# Patient Record
Sex: Male | Born: 1965 | Race: White | Hispanic: No | Marital: Married | Smoking: Never smoker
Health system: Southern US, Community
[De-identification: ages and names within clinical notes are randomized; demographics above are authoritative.]

## PROBLEM LIST (undated history)

## (undated) DIAGNOSIS — R7303 Prediabetes: Secondary | ICD-10-CM

## (undated) DIAGNOSIS — I10 Essential (primary) hypertension: Secondary | ICD-10-CM

## (undated) DIAGNOSIS — E785 Hyperlipidemia, unspecified: Secondary | ICD-10-CM

## (undated) HISTORY — DX: Hyperlipidemia, unspecified: E78.5

---

## 2017-06-16 ENCOUNTER — Emergency Department (HOSPITAL_COMMUNITY)
Admission: EM | Admit: 2017-06-16 | Discharge: 2017-06-16 | Disposition: A | Discharge status: Home / Self Care | Payer: Self-pay | Attending: Emergency Medicine | Admitting: Emergency Medicine

## 2017-06-16 ENCOUNTER — Encounter (HOSPITAL_COMMUNITY): Payer: Self-pay | Admitting: Emergency Medicine

## 2017-06-16 DIAGNOSIS — N3091 Cystitis, unspecified with hematuria: Secondary | ICD-10-CM | POA: Insufficient documentation

## 2017-06-16 DIAGNOSIS — Z7982 Long term (current) use of aspirin: Secondary | ICD-10-CM | POA: Insufficient documentation

## 2017-06-16 LAB — CBC WITH DIFFERENTIAL/PLATELET
Basophils Absolute: 0 10*3/uL (ref 0.0–0.1)
Basophils Relative: 0 %
EOS PCT: 0 %
Eosinophils Absolute: 0 10*3/uL (ref 0.0–0.7)
HEMATOCRIT: 45.3 % (ref 39.0–52.0)
Hemoglobin: 15.4 g/dL (ref 13.0–17.0)
LYMPHS ABS: 1.5 10*3/uL (ref 0.7–4.0)
LYMPHS PCT: 6 %
MCH: 31.4 pg (ref 26.0–34.0)
MCHC: 34 g/dL (ref 30.0–36.0)
MCV: 92.4 fL (ref 78.0–100.0)
MONO ABS: 3.1 10*3/uL — AB (ref 0.1–1.0)
Monocytes Relative: 13 %
NEUTROS ABS: 19.4 10*3/uL — AB (ref 1.7–7.7)
Neutrophils Relative %: 81 %
PLATELETS: 211 10*3/uL (ref 150–400)
RBC: 4.9 MIL/uL (ref 4.22–5.81)
RDW: 13.7 % (ref 11.5–15.5)
WBC: 24 10*3/uL — ABNORMAL HIGH (ref 4.0–10.5)

## 2017-06-16 LAB — COMPREHENSIVE METABOLIC PANEL
ALT: 25 U/L (ref 17–63)
AST: 32 U/L (ref 15–41)
Albumin: 4 g/dL (ref 3.5–5.0)
Alkaline Phosphatase: 69 U/L (ref 38–126)
Anion gap: 11 (ref 5–15)
BILIRUBIN TOTAL: 1.7 mg/dL — AB (ref 0.3–1.2)
BUN: 9 mg/dL (ref 6–20)
CHLORIDE: 100 mmol/L — AB (ref 101–111)
CO2: 26 mmol/L (ref 22–32)
CREATININE: 0.74 mg/dL (ref 0.61–1.24)
Calcium: 9.5 mg/dL (ref 8.9–10.3)
Glucose, Bld: 138 mg/dL — ABNORMAL HIGH (ref 65–99)
POTASSIUM: 3.3 mmol/L — AB (ref 3.5–5.1)
Sodium: 137 mmol/L (ref 135–145)
TOTAL PROTEIN: 8.1 g/dL (ref 6.5–8.1)

## 2017-06-16 LAB — URINALYSIS, ROUTINE W REFLEX MICROSCOPIC
Bilirubin Urine: NEGATIVE
GLUCOSE, UA: NEGATIVE mg/dL
Ketones, ur: NEGATIVE mg/dL
NITRITE: POSITIVE — AB
Protein, ur: 30 mg/dL — AB
SPECIFIC GRAVITY, URINE: 1.013 (ref 1.005–1.030)
pH: 8 (ref 5.0–8.0)

## 2017-06-16 MED ORDER — ONDANSETRON 8 MG PO TBDP: 8.0000 mg | ORAL_TABLET | Freq: Three times a day (TID) | ORAL | 0 refills | Status: DC | PRN

## 2017-06-16 MED ORDER — CEPHALEXIN 500 MG PO CAPS: 500.0000 mg | ORAL_CAPSULE | Freq: Four times a day (QID) | ORAL | 0 refills | Status: DC

## 2017-06-16 MED ORDER — DEXTROSE 5 % IV SOLN
1.0000 g | Freq: Once | INTRAVENOUS | Status: AC
  Administered 2017-06-16: 1 g via INTRAVENOUS
  Filled 2017-06-16: qty 10

## 2017-06-16 NOTE — Discharge Instructions (Signed)
Start taking the antibiotic prescription in the morning.  Make sure you are drinking plenty of hydrating beverages.  You may take motrin or tylenol if needed for fever reduction.  You may take zofran if needed for nausea.

## 2017-06-16 NOTE — ED Provider Notes (Signed)
[facility] DEPT Provider Note   CSN: [id number] Arrival date & time: 06/16/17  1734     History   Chief Complaint Chief Complaint  Patient presents with  . Hematuria    HPI Austin Smith is a 51 y.o. 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  He reports drinking large quantities of tomato juice prior to seeing blood in his urine and is concerned about this being a possible trigger.  He denies pain including back, flank or abdominal pain.  He has mild suprapubic pain after urination along with mild straining with urination and mild nausea without emesis.  He denies penile discharge and denies history of prostate issues.  He had was seen at an urgent care center tonight, had a fever to 101.5 there, was given tylenol and sent here when his urine was grossly bloody there.  The history is provided by the patient.    History reviewed. No pertinent past medical history.  There are no active problems to display for this patient.   History reviewed. No pertinent surgical history.     Home Medications    Prior to Admission medications   Medication Sig Start Date End Date Taking? Authorizing Provider  acetaminophen (TYLENOL) 500 MG tablet Take 1,000 mg by mouth once.   Yes [provider]  aspirin EC 81 MG tablet Take 81 mg by mouth daily.   Yes [provider]  cephALEXin (KEFLEX) 500 MG capsule Take 1 capsule (500 mg total) by mouth 4 (four) times daily. 06/16/17   Burgess AmorIdol, Jaiah Weigel, PA-C  ondansetron (ZOFRAN ODT) 8 MG disintegrating tablet Take 1 tablet (8 mg total) by mouth every 8 (eight) hours as needed for nausea or vomiting. 06/16/17   Burgess AmorIdol, Clair Alfieri, PA-C    Family History No family history on file.  Social History Social History  Substance Use Topics  . Smoking status: Never Smoker  . Smokeless tobacco: Never Used  .  Alcohol use No     Allergies   Patient has no known allergies.   Review of Systems Review of Systems  Constitutional: Positive for fever.  HENT: Negative for congestion and sore throat.   Eyes: Negative.   Respiratory: Negative for chest tightness and shortness of breath.   Cardiovascular: Negative for chest pain.  Gastrointestinal: Positive for nausea. Negative for abdominal pain and vomiting.  Genitourinary: Positive for dysuria, frequency and hematuria. Negative for discharge and urgency.  Musculoskeletal: Negative for arthralgias, joint swelling and neck pain.  Skin: Negative.  Negative for rash and wound.  Neurological: Negative for dizziness, weakness, light-headedness, numbness and headaches.  Psychiatric/Behavioral: Negative.      Physical Exam Updated Vital Signs BP (!) 151/85 (BP Location: Right Arm)   Pulse 80   Temp 99.4 F (37.4 C) (Oral)   Resp 18   Ht 6\' 1"  (1.854 m)   Wt 122.4 kg (269 lb 12.8 oz)   SpO2 94%   BMI 35.60 kg/m   Physical Exam  Constitutional: He appears well-developed and well-nourished. No distress.  HENT:  Head: Normocephalic and atraumatic.  Eyes: Conjunctivae are normal.  Neck: Normal range of motion.  Cardiovascular: Normal rate, regular rhythm, normal heart sounds and intact distal pulses.   Pulmonary/Chest: Effort normal and breath sounds normal. He has no wheezes.  Abdominal: Soft. Bowel sounds are normal. He exhibits no mass. There is tenderness in the suprapubic  area. There is no rigidity, no guarding and no CVA tenderness.  Musculoskeletal: Normal range of motion.  Neurological: He is alert.  Skin: Skin is warm and dry.  Psychiatric: He has a normal mood and affect.  Nursing note and vitals reviewed.    ED Treatments / Results  Labs (all labs ordered are listed, but only abnormal results are displayed) Labs Reviewed  CBC WITH DIFFERENTIAL/PLATELET - Abnormal; Notable for the following:       Result Value   WBC 24.0  (*)    Neutro Abs 19.4 (*)    Monocytes Absolute 3.1 (*)    All other components within normal limits  COMPREHENSIVE METABOLIC PANEL - Abnormal; Notable for the following:    Potassium 3.3 (*)    Chloride 100 (*)    Glucose, Bld 138 (*)    Total Bilirubin 1.7 (*)    All other components within normal limits  URINALYSIS, ROUTINE W REFLEX MICROSCOPIC - Abnormal; Notable for the following:    APPearance HAZY (*)    Hgb urine dipstick LARGE (*)    Protein, ur 30 (*)    Nitrite POSITIVE (*)    Leukocytes, UA SMALL (*)    Bacteria, UA RARE (*)    Squamous Epithelial / LPF 0-5 (*)    All other components within normal limits  CULTURE, BLOOD (ROUTINE X 2)  CULTURE, BLOOD (ROUTINE X 2)  URINE CULTURE    EKG  EKG Interpretation None       Radiology No results found.  Procedures Procedures (including critical care time)  Medications Ordered in ED Medications  cefTRIAXone (ROCEPHIN) 1 g in dextrose 5 % 50 mL IVPB (0 g Intravenous Stopped 06/16/17 2222)     Initial Impression / Assessment and Plan / ED Course  I have reviewed the triage vital signs and the nursing notes.  Pertinent labs & imaging results that were available during my care of the patient were reviewed by me and considered in my medical decision making (see chart for details).     Pt with acute hemorrhagic cystitis, urine cx sent.  Also with febrile illness, pending blood cx at this time, given fever and elevation of wbc count.  Pt appeared well during ed visit, tolerating po intake.  He was given rocephin IV while here, keflex, encouraged increased fluids. strict return precautions discussed. Close f/u with pcp or return here for worsened sx.  Pt and wife agree with and understand plan.  Discussed with Dr. Estell Harpin prior to dc home.   Final Clinical Impressions(s) / ED Diagnoses   Final diagnoses:  Hemorrhagic cystitis    New Prescriptions Discharge Medication List as of 06/16/2017 10:01 PM    START  taking these medications   Details  cephALEXin (KEFLEX) 500 MG capsule Take 1 capsule (500 mg total) by mouth 4 (four) times daily., Starting Fri 06/16/2017, Print         Burgess Amor, PA-C 06/17/17 1143    Bethann Berkshire, MD 06/17/17 [phone]

## 2017-06-16 NOTE — ED Triage Notes (Signed)
[facility], blood in urine, fever 101.5, given tylenol.  Pt states last night he drank a quart of homemade tomato juice. Notice red urine after that.  Some pain after voiding, and some strain, some incontinence.

## 2017-06-16 NOTE — ED Triage Notes (Signed)
Pt reports blood in urine   Has no PCP

## 2017-06-19 LAB — URINE CULTURE

## 2017-06-20 ENCOUNTER — Telehealth: Payer: Self-pay | Admitting: *Deleted

## 2017-06-20 NOTE — Telephone Encounter (Signed)
Post ED Visit - Positive Culture Follow-up  Culture report reviewed by antimicrobial stewardship pharmacist:  []  Enzo BiNathan Batchelder, Pharm.D. []  Celedonio MiyamotoJeremy Frens, Pharm.D., BCPS AQ-ID []  Garvin FilaMike Maccia, Pharm.D., BCPS []  Georgina PillionElizabeth Martin, [address] BCPS []  RollinsMinh Pham, [address]Pharm[address]D., BCPS, AAHIVP []  Estella HuskMichelle Turner, Pharm.D., BCPS, AAHIVP []  Lysle Pearlachel Rumbarger, PharmD, BCPS []  Casilda Carlsaylor Stone, PharmD, BCPS []  Pollyann SamplesAndy Johnston, PharmD, BCPS Verlan FriendsErin Deja, PharmD  Positive urine culture Treated with cephalexin, organism sensitive to the same and no further patient follow-up is required at this time.  Virl AxeRobertson, R[facility]en [facility] 06/20/2017, 10:23 AM

## 2017-06-21 LAB — CULTURE, BLOOD (ROUTINE X 2)
CULTURE: NO GROWTH
CULTURE: NO GROWTH

## 2018-03-14 DIAGNOSIS — W57XXXA Bitten or stung by nonvenomous insect and other nonvenomous arthropods, initial encounter: Secondary | ICD-10-CM | POA: Diagnosis not present

## 2018-03-14 DIAGNOSIS — L0390 Cellulitis, unspecified: Secondary | ICD-10-CM | POA: Diagnosis not present

## 2018-03-14 DIAGNOSIS — Z6835 Body mass index (BMI) 35.0-35.9, adult: Secondary | ICD-10-CM | POA: Diagnosis not present

## 2018-07-19 ENCOUNTER — Ambulatory Visit: Payer: BLUE CROSS/BLUE SHIELD | Admitting: Family Medicine

## 2018-07-19 ENCOUNTER — Encounter: Payer: Self-pay | Admitting: Family Medicine

## 2018-07-19 VITALS — BP 127/78 | HR 89 | Temp 97.5°F | Ht 73.0 in | Wt 270.0 lb

## 2018-07-19 DIAGNOSIS — R319 Hematuria, unspecified: Secondary | ICD-10-CM | POA: Diagnosis not present

## 2018-07-19 DIAGNOSIS — N3001 Acute cystitis with hematuria: Secondary | ICD-10-CM

## 2018-07-19 DIAGNOSIS — Z6835 Body mass index (BMI) 35.0-35.9, adult: Secondary | ICD-10-CM | POA: Diagnosis not present

## 2018-07-19 LAB — MICROSCOPIC EXAMINATION: RBC, UA: >30 /hpf — AB (ref 0–2)

## 2018-07-19 LAB — URINALYSIS, COMPLETE
Bilirubin, UA: NEGATIVE
Glucose, UA: NEGATIVE
KETONES UA: NEGATIVE
NITRITE UA: NEGATIVE
SPEC GRAV UA: 1.02 (ref 1.005–1.030)
Urobilinogen, Ur: 2 mg/dL — ABNORMAL HIGH (ref 0.2–1.0)
pH, UA: 7 (ref 5.0–7.5)

## 2018-07-19 MED ORDER — SULFAMETHOXAZOLE-TRIMETHOPRIM 800-160 MG PO TABS: 1.0000 | ORAL_TABLET | Freq: Two times a day (BID) | ORAL | 0 refills | Status: AC

## 2018-07-19 NOTE — Progress Notes (Signed)
Subjective:    Patient ID: Austin Smith, male    DOB: [birth year], 52 y.o.   MRN: [id number]  Chief Complaint:  Hematuria   HPI: Austin Smith is a 52 y.o. male presenting on 07/19/2018 for Hematuria  Pt presents today with complaints of frequency, urgency, dysuria, and hematuria. Pt states this started yesterday and has continued. States he has pressure in his lower abdomen after voiding, states 3/10 in nature. Pt states he has a pink to red tinge to his urine, denies clots. Pt denies exposure to STIs, denies penile discharge. Denies flank pain, fever, chills, or abdominal pain. Pt denies a weak stream, nocturnal voiding, or having to strain to start urination. Pt states he had the same symptoms last year and was treated with Rocephin and Keflex. Denies recurrence until yesterday. Pt states he has not had Mt. Dew in a long time, states he drank one yesterday and then these symptoms started. Pt states he has tried Aleve without relief of symptoms.   Relevant past medical, surgical, family and social history reviewed and updated as indicated. Interim medical history since our last visit reviewed. Allergies and medications reviewed and updated. DATA REVIEWED: CHART IN EPIC  Family History reviewed for pertinent findings.  History reviewed. No pertinent past medical history.  History reviewed. No pertinent surgical history.  Social History   Socioeconomic History  . Marital status: Married    Spouse name: Not on file  . Number of children: Not on file  . Years of education: Not on file  . Highest education level: Not on file  Occupational History  . Not on file  Social Needs  . Financial resource strain: Not on file  . Food insecurity:    Worry: Not on file    Inability: Not on file  . Transportation needs:    Medical: Not on file    Non-medical: Not on file  Tobacco Use  . Smoking status: Never Smoker  . Smokeless tobacco: Never Used  Substance and Sexual Activity  .  Alcohol use: No  . Drug use: No  . Sexual activity: Not on file  Lifestyle  . Physical activity:    Days per week: Not on file    Minutes per session: Not on file  . Stress: Not on file  Relationships  . Social connections:    Talks on phone: Not on file    Gets together: Not on file    Attends religious service: Not on file    Active member of club or organization: Not on file    Attends meetings of clubs or organizations: Not on file    Relationship status: Not on file  . Intimate partner violence:    Fear of current or ex partner: Not on file    Emotionally abused: Not on file    Physically abused: Not on file    Forced sexual activity: Not on file  Other Topics Concern  . Not on file  Social History Narrative  . Not on file    Allergies as of 07/19/2018   No Known Allergies     Medication List        Accurate as of 07/19/18  1:33 PM. Always use your most recent med list.          sulfamethoxazole-trimethoprim 800-160 MG tablet Commonly known as:  BACTRIM DS,SEPTRA DS Take 1 tablet by mouth 2 (two) times daily for 7 days.       No Known Allergies  Review of Systems  Constitutional: Negative for activity change, chills, diaphoresis, fatigue and fever.  Gastrointestinal: Negative for abdominal pain, constipation, diarrhea, nausea, rectal pain and vomiting.  Genitourinary: Positive for dysuria, frequency, hematuria and urgency. Negative for decreased urine volume, difficulty urinating, discharge, enuresis, flank pain, genital sores, penile pain, penile swelling, scrotal swelling and testicular pain.  All other systems reviewed and are negative.       Objective:    BP 127/78   Pulse 89   Temp (!) 97.5 F (36.4 C) (Oral)   Ht 6\' 1"  (1.854 m)   Wt 270 lb (122.5 kg)   BMI 35.62 kg/m    Wt Readings from Last 3 Encounters:  07/19/18 270 lb (122.5 kg)  06/16/17 269 lb 12.8 oz (122.4 kg)    Physical Exam  Constitutional: He is oriented to person, place,  and time. He appears well-developed and well-nourished.  HENT:  Head: Normocephalic.  Cardiovascular: Normal rate, regular rhythm and normal heart sounds.  Pulmonary/Chest: Effort normal and breath sounds normal.  Abdominal: Soft. Bowel sounds are normal. He exhibits no distension and no mass. There is no tenderness. There is no rebound, no guarding and no CVA tenderness.  Genitourinary: Penis normal.  Neurological: He is alert and oriented to person, place, and time.  Skin: Skin is warm and dry. Capillary refill takes less than 2 seconds.  Psychiatric: He has a normal mood and affect. His behavior is normal. Judgment and thought content normal.  Nursing note and vitals reviewed.      Urine dip in office: Leukocytes 2+, blood 3+, bilirubin 1+, protein 1+, nitrites negartive. Kari BaarsMichelle Brodyn Depuy, FNP-C Assessment & Plan:   1. Hematuria, unspecified type - Urinalysis, Complete - Urine Culture  2. Acute cystitis with hematuria Increase water intake. Medications as prescribed. Will be notified if antibiotic regimen needs to be changed due to culture results.  - Urine Culture - sulfamethoxazole-trimethoprim (BACTRIM DS) 800-160 MG tablet; Take 1 tablet by mouth 2 (two) times daily for 7 days.  Dispense: 14 tablet; Refill: 0  3. BMI 35.0-35.9,adult Weight management discussed. Diet and exercise.      Follow up plan: Return in about 2 weeks (around 08/02/2018), or if symptoms worsen or fail to improve. Pt will schedule an appointment for urine recheck and a complete physical.   Educational handout given for hematuria, urinary tract infection  The above assessment and management plan was discussed with the patient. The patient verbalized understanding of and has agreed to the management plan. Patient is aware to call the clinic if symptoms persist or worsen. Patient is aware when to return to the clinic for a follow-up visit. Patient educated on when it is appropriate to go to the emergency  department.   Kari BaarsMichelle Bora Bost, FNP-C [facility] [phone]

## 2018-07-19 NOTE — Patient Instructions (Signed)
Increase water intake. Return for fever, chills, abdominal or flank pain.  Hematuria, Adult Hematuria is blood in your urine. It can be caused by a bladder infection, kidney infection, prostate infection, kidney stone, or cancer of your urinary tract. Infections can usually be treated with medicine, and a kidney stone usually will pass through your urine. If neither of these is the cause of your hematuria, further workup to find out the reason may be needed. It is very important that you tell your health care provider about any blood you see in your urine, even if the blood stops without treatment or happens without causing pain. Blood in your urine that happens and then stops and then happens again can be a symptom of a very serious condition. Also, pain is not a symptom in the initial stages of many urinary cancers. Follow these instructions at home:  Drink lots of fluid, 3-4 quarts a day. If you have been diagnosed with an infection, cranberry juice is especially recommended, in addition to large amounts of water.  Avoid caffeine, tea, and carbonated beverages because they tend to irritate the bladder.  Avoid alcohol because it may irritate the prostate.  Take all medicines as directed by your health care provider.  If you were prescribed an antibiotic medicine, finish it all even if you start to feel better.  If you have been diagnosed with a kidney stone, follow your health care provider's instructions regarding straining your urine to catch the stone.  Empty your bladder often. Avoid holding urine for long periods of time.  After a bowel movement, women should cleanse front to back. Use each tissue only once.  Empty your bladder before and after sexual intercourse if you are a male. Contact a health care provider if:  You develop back pain.  You have a fever.  You have a feeling of sickness in your stomach (nausea) or vomiting.  Your symptoms are not better in 3 days. Return  sooner if you are getting worse. Get help right away if:  You develop severe vomiting and are unable to keep the medicine down.  You develop severe back or abdominal pain despite taking your medicines.  You begin passing a large amount of blood or clots in your urine.  You feel extremely weak or faint, or you pass out. This information is not intended to replace advice given to you by your health care provider. Make sure you discuss any questions you have with your health care provider. Document Released: 10/24/2005 Document Revised: 03/31/2016 Document Reviewed: 06/24/2013 Elsevier Interactive Patient Education  2017 [employer] Urinary Tract Infection, Adult A urinary tract infection (UTI) is an infection of any part of the urinary tract. The urinary tract includes the:  Kidneys.  Ureters.  Bladder.  Urethra.  These organs make, store, and get rid of pee (urine) in the body. Follow these instructions at home:  Take over-the-counter and prescription medicines only as told by your doctor.  If you were prescribed an antibiotic medicine, take it as told by your doctor. Do not stop taking the antibiotic even if you start to feel better.  Avoid the following drinks: ? Alcohol. ? Caffeine. ? Tea. ? Carbonated drinks.  Drink enough fluid to keep your pee clear or pale yellow.  Keep all follow-up visits as told by your doctor. This is important.  Make sure to: ? Empty your bladder often and completely. Do not to hold pee for long periods of time. ? Empty your bladder before  and after sex. ? Wipe from front to back after a bowel movement if you are male. Use each tissue one time when you wipe. Contact a doctor if:  You have back pain.  You have a fever.  You feel sick to your stomach (nauseous).  You throw up (vomit).  Your symptoms do not get better after 3 days.  Your symptoms go away and then come back. Get help right away if:  You have very bad back  pain.  You have very bad lower belly (abdominal) pain.  You are throwing up and cannot keep down any medicines or water. This information is not intended to replace advice given to you by your health care provider. Make sure you discuss any questions you have with your health care provider. Document Released: 04/11/2008 Document Revised: 03/31/2016 Document Reviewed: 09/14/2015 Elsevier Interactive Patient Education  [employer]2018 [employer]

## 2018-07-21 LAB — URINE CULTURE

## 2018-07-23 ENCOUNTER — Other Ambulatory Visit: Payer: Self-pay | Admitting: Family Medicine

## 2018-08-13 ENCOUNTER — Encounter: Payer: Self-pay | Admitting: Family Medicine

## 2018-08-13 ENCOUNTER — Ambulatory Visit: Payer: BLUE CROSS/BLUE SHIELD | Admitting: Family Medicine

## 2018-08-13 VITALS — BP 142/76 | HR 50 | Temp 97.4°F | Ht 73.0 in | Wt 267.0 lb

## 2018-08-13 DIAGNOSIS — Z87440 Personal history of urinary (tract) infections: Secondary | ICD-10-CM | POA: Diagnosis not present

## 2018-08-13 DIAGNOSIS — R311 Benign essential microscopic hematuria: Secondary | ICD-10-CM | POA: Diagnosis not present

## 2018-08-13 LAB — URINALYSIS, COMPLETE
Bilirubin, UA: NEGATIVE
Glucose, UA: NEGATIVE
KETONES UA: NEGATIVE
Leukocytes, UA: NEGATIVE
NITRITE UA: NEGATIVE
Protein, UA: NEGATIVE
Specific Gravity, UA: 1.025 (ref 1.005–1.030)
UUROB: 0.2 mg/dL (ref 0.2–1.0)
pH, UA: 5 (ref 5.0–7.5)

## 2018-08-13 LAB — MICROSCOPIC EXAMINATION
BACTERIA UA: NONE SEEN
Renal Epithel, UA: NONE SEEN /hpf
WBC UA: NONE SEEN /HPF (ref 0–5)

## 2018-08-13 NOTE — Progress Notes (Addendum)
Subjective:    Patient ID: Austin Smith, male    DOB: 1966-05-10, 52 y.o.   MRN: [id number]  Chief Complaint:  Follow up hematuria   HPI: Austin Smith is a 52 y.o. male presenting on 08/13/2018 for Follow up hematuria   1. History of UTI   2. Benign essential microscopic hematuria   Pt presents today for follow up of UTI with hematuria. Pt denies ongoing symptoms. States he has cut back on [place]. Dew intake and has increased water intake.    Relevant past medical, surgical, family, and social history reviewed and updated as indicated.  Allergies and medications reviewed and updated.   History reviewed. No pertinent past medical history.  History reviewed. No pertinent surgical history.  Social History   Socioeconomic History  . Marital status: Married    Spouse name: Not on file  . Number of children: Not on file  . Years of education: Not on file  . Highest education level: Not on file  Occupational History  . Not on file  Social Needs  . Financial resource strain: Not on file  . Food insecurity:    Worry: Not on file    Inability: Not on file  . Transportation needs:    Medical: Not on file    Non-medical: Not on file  Tobacco Use  . Smoking status: Never Smoker  . Smokeless tobacco: Never Used  Substance and Sexual Activity  . Alcohol use: No  . Drug use: No  . Sexual activity: Not on file  Lifestyle  . Physical activity:    Days per week: Not on file    Minutes per session: Not on file  . Stress: Not on file  Relationships  . Social connections:    Talks on phone: Not on file    Gets together: Not on file    Attends religious service: Not on file    Active member of club or organization: Not on file    Attends meetings of clubs or organizations: Not on file    Relationship status: Not on file  . Intimate partner violence:    Fear of current or ex partner: Not on file    Emotionally abused: Not on file    Physically abused: Not on file    Forced  sexual activity: Not on file  Other Topics Concern  . Not on file  Social History Narrative  . Not on file    No outpatient encounter medications on file as of 08/13/2018.   No facility-administered encounter medications on file as of 08/13/2018.     No Known Allergies  Review of Systems  Constitutional: Negative for activity change, appetite change, chills, fatigue and fever.  HENT: Negative.   Eyes: Negative.   Respiratory: Negative for cough, chest tightness and shortness of breath.   Cardiovascular: Negative for chest pain, palpitations and leg swelling.  Gastrointestinal: Negative for abdominal pain, nausea and vomiting.  Endocrine: Negative.   Genitourinary: Negative for decreased urine volume, difficulty urinating, discharge, dysuria, flank pain, frequency, hematuria, penile pain, scrotal swelling, testicular pain and urgency.  Musculoskeletal: Negative for arthralgias and myalgias.  Skin: Negative.   Allergic/Immunologic: Negative.   Neurological: Negative for dizziness and headaches.  Hematological: Negative.   Psychiatric/Behavioral: Negative for confusion, hallucinations, sleep disturbance and suicidal ideas.  All other systems reviewed and are negative.       Objective:    BP (!) 142/76   Pulse (!) 50   Temp (!) 97.4  F (36.3 C) (Oral)   Ht 6\' 1"  (1.854 m)   Wt 267 lb (121.1 kg)   BMI 35.23 kg/m    Wt Readings from Last 3 Encounters:  08/13/18 267 lb (121.1 kg)  07/19/18 270 lb (122.5 kg)  06/16/17 269 lb 12.8 oz (122.4 kg)    Physical Exam  Constitutional: He is oriented to person, place, and time. He appears well-developed and well-nourished. No distress.  HENT:  Head: Normocephalic and atraumatic.  Eyes: Pupils are equal, round, and reactive to light. Conjunctivae and EOM are normal.  Neck: No tracheal deviation present. No thyromegaly present.  Cardiovascular: Normal rate, regular rhythm and normal heart sounds. Exam reveals no gallop and no  friction rub.  No murmur heard. Pulmonary/Chest: Effort normal and breath sounds normal. No respiratory distress. He has no wheezes.  Abdominal: Soft. Bowel sounds are normal. There is no tenderness.  Neurological: He is alert and oriented to person, place, and time.  Skin: Skin is warm and dry. Capillary refill takes less than 2 seconds. He is not diaphoretic.  Psychiatric: He has a normal mood and affect. His behavior is normal. Judgment and thought content normal.  Nursing note and vitals reviewed.   Results for orders placed or performed in visit on 07/19/18  Urine Culture  Result Value Ref Range   Urine Culture, Routine Final report (A)    Organism ID, Bacteria Escherichia coli (A)    Antimicrobial Susceptibility Comment   Microscopic Examination  Result Value Ref Range   WBC, UA 6-10 (A) 0 - 5 /hpf   RBC, UA >30 (A) 0 - 2 /hpf   Epithelial Cells (non renal) 0-10 0 - 10 /hpf   Renal Epithel, UA 0-10 (A) None seen /hpf   Bacteria, UA Moderate (A) None seen/Few  Urinalysis, Complete  Result Value Ref Range   Specific Gravity, UA 1.020 1.005 - 1.030   pH, UA 7.0 5.0 - 7.5   Color, UA Amber (A) Yellow   Appearance Ur Cloudy (A) Clear   Leukocytes, UA 2+ (A) Negative   Protein, UA 1+ (A) Negative/Trace   Glucose, UA Negative Negative   Ketones, UA Negative Negative   RBC, UA 3+ (A) Negative   Bilirubin, UA Negative Negative   Urobilinogen, Ur 2.0 (H) 0.2 - 1.0 mg/dL   Nitrite, UA Negative Negative   Microscopic Examination See below:      Trace RBCs on urinalysis in office today. Austin Baars, FNP-C, [facility]  Pertinent labs & imaging results that were available during my care of the patient were reviewed by me and considered in my medical decision making.  Assessment & Plan:  Austin Smith was seen today for follow up hematuria.  Diagnoses and all orders for this visit:  History of UTI -     Urinalysis, Complete   Benign Essential Microscopic Hematuria Increase water  intake, frequent voiding. Report return of hematuria or new symptoms.   Continue all other maintenance medications.  Follow up plan: Return if symptoms worsen or fail to improve.  Educational handout given for hematuria  The above assessment and management plan was discussed with the patient. The patient verbalized understanding of and has agreed to the management plan. Patient is aware to call the clinic if symptoms persist or worsen. Patient is aware when to return to the clinic for a follow-up visit. Patient educated on when it is appropriate to go to the emergency department.   Austin Baars, FNP-C [facility] [phone]

## 2018-08-13 NOTE — Patient Instructions (Signed)

## 2018-10-02 ENCOUNTER — Encounter: Payer: Self-pay | Admitting: Family Medicine

## 2018-10-02 ENCOUNTER — Ambulatory Visit (INDEPENDENT_AMBULATORY_CARE_PROVIDER_SITE_OTHER): Payer: BLUE CROSS/BLUE SHIELD | Admitting: Family Medicine

## 2018-10-02 VITALS — BP 148/78 | HR 52 | Temp 97.2°F | Ht 73.0 in | Wt 273.4 lb

## 2018-10-02 DIAGNOSIS — Z0000 Encounter for general adult medical examination without abnormal findings: Secondary | ICD-10-CM

## 2018-10-02 DIAGNOSIS — Z125 Encounter for screening for malignant neoplasm of prostate: Secondary | ICD-10-CM

## 2018-10-02 DIAGNOSIS — Z1211 Encounter for screening for malignant neoplasm of colon: Secondary | ICD-10-CM

## 2018-10-02 DIAGNOSIS — R030 Elevated blood-pressure reading, without diagnosis of hypertension: Secondary | ICD-10-CM

## 2018-10-02 DIAGNOSIS — Z6835 Body mass index (BMI) 35.0-35.9, adult: Secondary | ICD-10-CM

## 2018-10-02 DIAGNOSIS — Z1212 Encounter for screening for malignant neoplasm of rectum: Secondary | ICD-10-CM

## 2018-10-02 DIAGNOSIS — R3129 Other microscopic hematuria: Secondary | ICD-10-CM

## 2018-10-02 LAB — URINALYSIS, COMPLETE
Bilirubin, UA: NEGATIVE
GLUCOSE, UA: NEGATIVE
KETONES UA: NEGATIVE
Leukocytes, UA: NEGATIVE
NITRITE UA: NEGATIVE
Protein, UA: NEGATIVE
Specific Gravity, UA: 1.025 (ref 1.005–1.030)
UUROB: 0.2 mg/dL (ref 0.2–1.0)
pH, UA: 5.5 (ref 5.0–7.5)

## 2018-10-02 LAB — MICROSCOPIC EXAMINATION
BACTERIA UA: NONE SEEN
RBC MICROSCOPIC, UA: NONE SEEN /HPF (ref 0–2)
Renal Epithel, UA: NONE SEEN /hpf
WBC UA: NONE SEEN /HPF (ref 0–5)

## 2018-10-02 NOTE — Patient Instructions (Signed)
Exercising to Lose Weight Exercising can help you to lose weight. In order to lose weight through exercise, you need to do vigorous-intensity exercise. You can tell that you are exercising with vigorous intensity if you are breathing very hard and fast and cannot hold a conversation while exercising. Moderate-intensity exercise helps to maintain your current weight. You can tell that you are exercising at a moderate level if you have a higher heart rate and faster breathing, but you are still able to hold a conversation. How often should I exercise? Choose an activity that you enjoy and set realistic goals. Your health care provider can help you to make an activity plan that works for you. Exercise regularly as directed by your health care provider. This may include:  Doing resistance training twice each week, such as: ? Push-ups. ? Sit-ups. ? Lifting weights. ? Using resistance bands.  Doing a given intensity of exercise for a given amount of time. Choose from these options: ? 150 minutes of moderate-intensity exercise every week. ? 75 minutes of vigorous-intensity exercise every week. ? A mix of moderate-intensity and vigorous-intensity exercise every week.  Children, pregnant women, people who are out of shape, people who are overweight, and older adults may need to consult a health care provider for individual recommendations. If you have any sort of medical condition, be sure to consult your health care provider before starting a new exercise program. What are some activities that can help me to lose weight?  Walking at a rate of at least 4.5 miles an hour.  Jogging or running at a rate of 5 miles per hour.  Biking at a rate of at least 10 miles per hour.  Lap swimming.  Roller-skating or in-line skating.  Cross-country skiing.  Vigorous competitive sports, such as football, basketball, and soccer.  Jumping rope.  Aerobic dancing. How can I be more active in my day-to-day  activities?  Use the stairs instead of the elevator.  Take a walk during your lunch break.  If you drive, park your car farther away from work or school.  If you take public transportation, get off one stop early and walk the rest of the way.  Make all of your phone calls while standing up and walking around.  Get up, stretch, and walk around every 30 minutes throughout the day. What guidelines should I follow while exercising?  Do not exercise so much that you hurt yourself, feel dizzy, or get very short of breath.  Consult your health care provider prior to starting a new exercise program.  Wear comfortable clothes and shoes with good support.  Drink plenty of water while you exercise to prevent dehydration or heat stroke. Body water is lost during exercise and must be replaced.  Work out until you breathe faster and your heart beats faster. This information is not intended to replace advice given to you by your health care provider. Make sure you discuss any questions you have with your health care provider. Document Released: 11/26/2010 Document Revised: 03/31/2016 Document Reviewed: 03/27/2014 Elsevier Interactive Patient Education  2018 [employer] DASH Eating Plan DASH stands for "Dietary Approaches to Stop Hypertension." The DASH eating plan is a healthy eating plan that has been shown to reduce high blood pressure (hypertension). It may also reduce your risk for type 2 diabetes, heart disease, and stroke. The DASH eating plan may also help with weight loss. What are tips for following this plan? General guidelines  Avoid eating more than 2,300 mg (  milligrams) of salt (sodium) a day. If you have hypertension, you may need to reduce your sodium intake to 1,500 mg a day.  Limit alcohol intake to no more than 1 drink a day for nonpregnant women and 2 drinks a day for men. One drink equals 12 oz of beer, 5 oz of wine, or 1 oz of hard liquor.  Work with your health care  provider to maintain a healthy body weight or to lose weight. Ask what an ideal weight is for you.  Get at least 30 minutes of exercise that causes your heart to beat faster (aerobic exercise) most days of the week. Activities may include walking, swimming, or biking.  Work with your health care provider or diet and nutrition specialist (dietitian) to adjust your eating plan to your individual calorie needs. Reading food labels  Check food labels for the amount of sodium per serving. Choose foods with less than 5 percent of the Daily Value of sodium. Generally, foods with less than 300 mg of sodium per serving fit into this eating plan.  To find whole grains, look for the word "whole" as the first word in the ingredient list. Shopping  Buy products labeled as "low-sodium" or "no salt added."  Buy fresh foods. Avoid canned foods and premade or frozen meals. Cooking  Avoid adding salt when cooking. Use salt-free seasonings or herbs instead of table salt or sea salt. Check with your health care provider or pharmacist before using salt substitutes.  Do not fry foods. Cook foods using healthy methods such as baking, boiling, grilling, and broiling instead.  Cook with heart-healthy oils, such as olive, canola, soybean, or sunflower oil. Meal planning   Eat a balanced diet that includes: ? 5 or more servings of fruits and vegetables each day. At each meal, try to fill half of your plate with fruits and vegetables. ? Up to 6-8 servings of whole grains each day. ? Less than 6 oz of lean meat, poultry, or fish each day. A 3-oz serving of meat is about the same size as a deck of cards. One egg equals 1 oz. ? 2 servings of low-fat dairy each day. ? A serving of nuts, seeds, or beans 5 times each week. ? Heart-healthy fats. Healthy fats called Omega-3 fatty acids are found in foods such as flaxseeds and coldwater fish, like sardines, salmon, and mackerel.  Limit how much you eat of the  following: ? Canned or prepackaged foods. ? Food that is high in trans fat, such as fried foods. ? Food that is high in saturated fat, such as fatty meat. ? Sweets, desserts, sugary drinks, and other foods with added sugar. ? Full-fat dairy products.  Do not salt foods before eating.  Try to eat at least 2 vegetarian meals each week.  Eat more home-cooked food and less restaurant, buffet, and fast food.  When eating at a restaurant, ask that your food be prepared with less salt or no salt, if possible. What foods are recommended? The items listed may not be a complete list. Talk with your dietitian about what dietary choices are best for you. Grains Whole-grain or whole-wheat bread. Whole-grain or whole-wheat pasta. Brown rice. Orpah Cobb. Bulgur. Whole-grain and low-sodium cereals. Pita bread. Low-fat, low-sodium crackers. Whole-wheat flour tortillas. Vegetables Fresh or frozen vegetables (raw, steamed, roasted, or grilled). Low-sodium or reduced-sodium tomato and vegetable juice. Low-sodium or reduced-sodium tomato sauce and tomato paste. Low-sodium or reduced-sodium canned vegetables. Fruits All fresh, dried, or frozen fruit.  Canned fruit in natural juice (without added sugar). Meat and other protein foods Skinless chicken or [place]. Ground chicken or [place]. Pork with fat trimmed off. Fish and seafood. Egg whites. Dried beans, peas, or lentils. Unsalted nuts, nut butters, and seeds. Unsalted canned beans. Lean cuts of beef with fat trimmed off. Low-sodium, lean deli meat. Dairy Low-fat (1%) or fat-free (skim) milk. Fat-free, low-fat, or reduced-fat cheeses. Nonfat, low-sodium ricotta or cottage cheese. Low-fat or nonfat yogurt. Low-fat, low-sodium cheese. Fats and oils Soft margarine without trans fats. Vegetable oil. Low-fat, reduced-fat, or light mayonnaise and salad dressings (reduced-sodium). Canola, safflower, olive, soybean, and sunflower oils. Avocado. Seasoning and other  foods Herbs. Spices. Seasoning mixes without salt. Unsalted popcorn and pretzels. Fat-free sweets. What foods are not recommended? The items listed may not be a complete list. Talk with your dietitian about what dietary choices are best for you. Grains Baked goods made with fat, such as croissants, muffins, or some breads. Dry pasta or rice meal packs. Vegetables Creamed or fried vegetables. Vegetables in a cheese sauce. Regular canned vegetables (not low-sodium or reduced-sodium). Regular canned tomato sauce and paste (not low-sodium or reduced-sodium). Regular tomato and vegetable juice (not low-sodium or reduced-sodium). Rosita Fire. Olives. Fruits Canned fruit in a light or heavy syrup. Fried fruit. Fruit in cream or butter sauce. Meat and other protein foods Fatty cuts of meat. Ribs. Fried meat. Tomasa Blase. Sausage. Bologna and other processed lunch meats. Salami. Fatback. Hotdogs. Bratwurst. Salted nuts and seeds. Canned beans with added salt. Canned or smoked fish. Whole eggs or egg yolks. Chicken or [place] with skin. Dairy Whole or 2% milk, cream, and half-and-half. Whole or full-fat cream cheese. Whole-fat or sweetened yogurt. Full-fat cheese. Nondairy creamers. Whipped toppings. Processed cheese and cheese spreads. Fats and oils Butter. Stick margarine. Lard. Shortening. Ghee. Bacon fat. Tropical oils, such as coconut, palm kernel, or palm oil. Seasoning and other foods Salted popcorn and pretzels. Onion salt, garlic salt, seasoned salt, table salt, and sea salt. Worcestershire sauce. Tartar sauce. Barbecue sauce. Teriyaki sauce. Soy sauce, including reduced-sodium. Steak sauce. Canned and packaged gravies. Fish sauce. Oyster sauce. Cocktail sauce. Horseradish that you find on the shelf. Ketchup. Mustard. Meat flavorings and tenderizers. Bouillon cubes. Hot sauce and Tabasco sauce. Premade or packaged marinades. Premade or packaged taco seasonings. Relishes. Regular salad dressings. Where to find  more information:  National Heart, Lung, and Blood Institute: PopSteam.is  American Heart Association: www.heart.org Summary  The DASH eating plan is a healthy eating plan that has been shown to reduce high blood pressure (hypertension). It may also reduce your risk for type 2 diabetes, heart disease, and stroke.  With the DASH eating plan, you should limit salt (sodium) intake to 2,300 mg a day. If you have hypertension, you may need to reduce your sodium intake to 1,500 mg a day.  When on the DASH eating plan, aim to eat more fresh fruits and vegetables, whole grains, lean proteins, low-fat dairy, and heart-healthy fats.  Work with your health care provider or diet and nutrition specialist (dietitian) to adjust your eating plan to your individual calorie needs. This information is not intended to replace advice given to you by your health care provider. Make sure you discuss any questions you have with your health care provider. Document Released: 10/13/2011 Document Revised: 10/17/2016 Document Reviewed: 10/17/2016 Elsevier Interactive Patient Education  2018 [employer]. Health Maintenance, Male A healthy lifestyle and preventive care is important for your health and wellness. Ask your health care provider  about what schedule of regular examinations is right for you. What should I know about weight and diet? Eat a Healthy Diet  Eat plenty of vegetables, fruits, whole grains, low-fat dairy products, and lean protein.  Do not eat a lot of foods high in solid fats, added sugars, or salt.  Maintain a Healthy Weight Regular exercise can help you achieve or maintain a healthy weight. You should:  Do at least 150 minutes of exercise each week. The exercise should increase your heart rate and make you sweat (moderate-intensity exercise).  Do strength-training exercises at least twice a week.  Watch Your Levels of Cholesterol and Blood Lipids  Have your blood tested for lipids  and cholesterol every 5 years starting at 52 years of age. If you are at high risk for heart disease, you should start having your blood tested when you are 52 years old. You may need to have your cholesterol levels checked more often if: ? Your lipid or cholesterol levels are high. ? You are older than 52 years of age. ? You are at high risk for heart disease.  What should I know about cancer screening? Many types of cancers can be detected early and may often be prevented. Lung Cancer  You should be screened every year for lung cancer if: ? You are a current smoker who has smoked for at least 30 years. ? You are a former smoker who has quit within the past 15 years.  Talk to your health care provider about your screening options, when you should start screening, and how often you should be screened.  Colorectal Cancer  Routine colorectal cancer screening usually begins at 52 years of age and should be repeated every 5-10 years until you are 52 years old. You may need to be screened more often if early forms of precancerous polyps or small growths are found. Your health care provider may recommend screening at an earlier age if you have risk factors for colon cancer.  Your health care provider may recommend using home test kits to check for hidden blood in the stool.  A small camera at the end of a tube can be used to examine your colon (sigmoidoscopy or colonoscopy). This checks for the earliest forms of colorectal cancer.  Prostate and Testicular Cancer  Depending on your age and overall health, your health care provider may do certain tests to screen for prostate and testicular cancer.  Talk to your health care provider about any symptoms or concerns you have about testicular or prostate cancer.  Skin Cancer  Check your skin from head to toe regularly.  Tell your health care provider about any new moles or changes in moles, especially if: ? There is a change in a mole's size,  shape, or color. ? You have a mole that is larger than a pencil eraser.  Always use sunscreen. Apply sunscreen liberally and repeat throughout the day.  Protect yourself by wearing long sleeves, pants, a wide-brimmed hat, and sunglasses when outside.  What should I know about heart disease, diabetes, and high blood pressure?  If you are 72-59 years of age, have your blood pressure checked every 3-5 years. If you are 58 years of age or older, have your blood pressure checked every year. You should have your blood pressure measured twice-once when you are at a hospital or clinic, and once when you are not at a hospital or clinic. Record the average of the two measurements. To check your blood pressure  when you are not at a hospital or clinic, you can use: ? An automated blood pressure machine at a pharmacy. ? A home blood pressure monitor.  Talk to your health care provider about your target blood pressure.  If you are between 50-55 years old, ask your health care provider if you should take aspirin to prevent heart disease.  Have regular diabetes screenings by checking your fasting blood sugar level. ? If you are at a normal weight and have a low risk for diabetes, have this test once every three years after the age of 82. ? If you are overweight and have a high risk for diabetes, consider being tested at a younger age or more often.  A one-time screening for abdominal aortic aneurysm (AAA) by ultrasound is recommended for men aged 65-75 years who are current or former smokers. What should I know about preventing infection? Hepatitis B If you have a higher risk for hepatitis B, you should be screened for this virus. Talk with your health care provider to find out if you are at risk for hepatitis B infection. Hepatitis C Blood testing is recommended for:  Everyone born from [birth year] through 1965.  Anyone with known risk factors for hepatitis C.  Sexually Transmitted Diseases (STDs)  You  should be screened each year for STDs including gonorrhea and chlamydia if: ? You are sexually active and are younger than 52 years of age. ? You are older than 52 years of age and your health care provider tells you that you are at risk for this type of infection. ? Your sexual activity has changed since you were last screened and you are at an increased risk for chlamydia or gonorrhea. Ask your health care provider if you are at risk.  Talk with your health care provider about whether you are at high risk of being infected with HIV. Your health care provider may recommend a prescription medicine to help prevent HIV infection.  What else can I do?  Schedule regular health, dental, and eye exams.  Stay current with your vaccines (immunizations).  Do not use any tobacco products, such as cigarettes, chewing tobacco, and e-cigarettes. If you need help quitting, ask your health care provider.  Limit alcohol intake to no more than 2 drinks per day. One drink equals 12 ounces of beer, 5 ounces of wine, or 1 ounces of hard liquor.  Do not use street drugs.  Do not share needles.  Ask your health care provider for help if you need support or information about quitting drugs.  Tell your health care provider if you often feel depressed.  Tell your health care provider if you have ever been abused or do not feel safe at home. This information is not intended to replace advice given to you by your health care provider. Make sure you discuss any questions you have with your health care provider. Document Released: 04/21/2008 Document Revised: 06/22/2016 Document Reviewed: 07/28/2015 Elsevier Interactive Patient Education  [employer].

## 2018-10-02 NOTE — Progress Notes (Signed)
Subjective:    Patient ID: Austin Smith, male    DOB: 10-17-1966, 52 y.o.   MRN: [id number]  Chief Complaint:  Annual Exam (patient is fasting)   HPI: Austin Smith is a 52 y.o. male presenting on 10/02/2018 for Annual Exam (patient is fasting)   Pt presents today for his annual physical exam. Pt states he has been doing well overall. Denies complaints or concerns. 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 States that he tries to watch his diet as best as he can. Pts blood pressure is noted to be elevated in office today and was at last visit. Pt states he feels this is due to nerves. States he monitors his blood pressure at home and it runs 120/70 - 130/80. He denies headaches, chest pain, leg swelling, dizziness, or confusion.   Relevant past medical, surgical, family, and social history reviewed and updated as indicated.  Allergies and medications reviewed and updated.   History reviewed. No pertinent past medical history.  History reviewed. No pertinent surgical history.  Social History   Socioeconomic History  . Marital status: Married    Spouse name: Not on file  . Number of children: Not on file  . Years of education: Not on file  . Highest education level: Not on file  Occupational History  . Not on file  Social Needs  . Financial resource strain: Not on file  . Food insecurity:    Worry: Not on file    Inability: Not on file  . Transportation needs:    Medical: Not on file    Non-medical: Not on file  Tobacco Use  . Smoking status: Never Smoker  . Smokeless tobacco: Never Used  Substance and Sexual Activity  . Alcohol use: No  . Drug use: No  . Sexual activity: Yes    Birth control/protection: None  Lifestyle  . Physical activity:    Days per week: 3 days    Minutes per session: 20 min  . Stress: Only a little  Relationships  . Social connections:    Talks on phone: Not on file    Gets together: Not on file   Attends religious service: Not on file    Active member of club or organization: Not on file    Attends meetings of clubs or organizations: Not on file    Relationship status: Not on file  . Intimate partner violence:    Fear of current or ex partner: Not on file    Emotionally abused: Not on file    Physically abused: Not on file    Forced sexual activity: Not on file  Other Topics Concern  . Not on file  Social History Narrative  . Not on file    No outpatient encounter medications on file as of 10/02/2018.   No facility-administered encounter medications on file as of 10/02/2018.     No Known Allergies  Review of Systems  Constitutional: Negative for activity change, chills, fatigue and fever.  HENT: Negative for congestion and nosebleeds.   Eyes: Negative for photophobia and visual disturbance.  Respiratory: Negative for chest tightness and shortness of breath.   Cardiovascular: Negative for chest pain, palpitations and leg swelling.  Gastrointestinal: Negative for abdominal pain, blood in stool, constipation, diarrhea, nausea, rectal pain and vomiting.  Endocrine: Negative for polydipsia, polyphagia and polyuria.  Genitourinary: Negative for decreased urine volume, difficulty urinating, discharge, dysuria, flank pain, frequency, hematuria,  penile pain, penile swelling, scrotal swelling, testicular pain and urgency.  Musculoskeletal: Negative for arthralgias, back pain, joint swelling, myalgias and neck pain.  Neurological: Negative for dizziness, syncope, weakness, light-headedness and headaches.  Psychiatric/Behavioral: Negative for behavioral problems and confusion.  All other systems reviewed and are negative.       Objective:    BP (!) 148/78 (BP Location: Right Arm, Cuff Size: Normal)   Pulse (!) 52   Temp (!) 97.2 F (36.2 C) (Oral)   Ht 6' 1"  (1.854 m)   Wt 273 lb 6 oz (124 kg)   BMI 36.07 kg/m    Wt Readings from Last 3 Encounters:  10/02/18 273 lb 6 oz  (124 kg)  08/13/18 267 lb (121.1 kg)  07/19/18 270 lb (122.5 kg)    Physical Exam  Constitutional: He is oriented to person, place, and time. He appears well-developed and well-nourished. He is cooperative.  HENT:  Head: Normocephalic and atraumatic.  Right Ear: Hearing, tympanic membrane, external ear and ear canal normal.  Left Ear: Hearing, tympanic membrane, external ear and ear canal normal.  Nose: Nose normal.  Mouth/Throat: Uvula is midline, oropharynx is clear and moist and mucous membranes are normal.  Eyes: Pupils are equal, round, and reactive to light. Conjunctivae, EOM and lids are normal.  Neck: Trachea normal, full passive range of motion without pain and phonation normal. Neck supple. No JVD present. Carotid bruit is not present. No thyroid mass and no thyromegaly present.  Cardiovascular: Normal rate, regular rhythm, normal heart sounds and intact distal pulses. Exam reveals no gallop and no friction rub.  No murmur heard. Pulmonary/Chest: Effort normal and breath sounds normal. No respiratory distress.  Abdominal: Soft. Normal appearance and bowel sounds are normal. There is no tenderness.  Musculoskeletal: Normal range of motion.  Lymphadenopathy:    He has no cervical adenopathy.  Neurological: He is alert and oriented to person, place, and time. He has normal strength and normal reflexes. No cranial nerve deficit or sensory deficit. Coordination normal.  Skin: Skin is warm, dry and intact. Capillary refill takes less than 2 seconds.  Psychiatric: He has a normal mood and affect. His speech is normal and behavior is normal. Judgment and thought content normal. Cognition and memory are normal.  Nursing note and vitals reviewed.   Results for orders placed or performed in visit on 08/13/18  Microscopic Examination  Result Value Ref Range   WBC, UA None seen 0 - 5 /hpf   RBC, UA 3-10 (A) 0 - 2 /hpf   Epithelial Cells (non renal) 0-10 0 - 10 /hpf   Renal Epithel, UA  None seen None seen /hpf   Mucus, UA Present Not Estab.   Bacteria, UA None seen None seen/Few  Urinalysis, Complete  Result Value Ref Range   Specific Gravity, UA 1.025 1.005 - 1.030   pH, UA 5.0 5.0 - 7.5   Color, UA Yellow Yellow   Appearance Ur Clear Clear   Leukocytes, UA Negative Negative   Protein, UA Negative Negative/Trace   Glucose, UA Negative Negative   Ketones, UA Negative Negative   RBC, UA Trace (A) Negative   Bilirubin, UA Negative Negative   Urobilinogen, Ur 0.2 0.2 - 1.0 mg/dL   Nitrite, UA Negative Negative   Microscopic Examination See below:        Pertinent labs & imaging results that were available during my care of the patient were reviewed by me and considered in my medical decision making.  Assessment & Plan:  Austin Smith was seen today for annual exam.  Diagnoses and all orders for this visit:  Annual physical exam Health Maintenance discussed. Diet and exercise encouraged.  -     CMP14+EGFR -     CBC with Differential/Platelet -     Lipid panel -     TSH -     PSA, total and free -     HIV Antibody (routine testing w rflx) -     Cologuard -     Urinalysis, Complete  BMI 35.0-35.9,adult Diet and exercise encouraged. Will recheck in 3 months.  -     CMP14+EGFR -     Lipid panel -     TSH  Screening for colorectal cancer Declined colonoscopy. Willing to complete cologuard.  -     Cologuard  Screening for prostate cancer History of frequency and urgency with hematuria. Opted for PSA today.  -     PSA, total and free  Other microscopic hematuria Denies frequency, hematuria, or dysuria. UA today to see if hematuria has resolved.  -     Cancel: Urinalysis, Routine w reflex microscopic  Elevated blood pressure reading Life style modifications discussed. Pt will monitor BP at home and bring to next office appointment. Pt aware of signs and symptoms to report.      Continue all other maintenance medications.  Follow up plan: Return in  about 3 months (around 01/02/2019), or if symptoms worsen or fail to improve.  Educational handout given for Health Maintenance, DASH diet, Exercise   The above assessment and management plan was discussed with the patient. The patient verbalized understanding of and has agreed to the management plan. Patient is aware to call the clinic if symptoms persist or worsen. Patient is aware when to return to the clinic for a follow-up visit. Patient educated on when it is appropriate to go to the emergency department.   Monia Pouch, FNP-C [facility] [phone]

## 2018-10-03 ENCOUNTER — Other Ambulatory Visit: Payer: Self-pay | Admitting: Family Medicine

## 2018-10-03 DIAGNOSIS — E1169 Type 2 diabetes mellitus with other specified complication: Secondary | ICD-10-CM | POA: Insufficient documentation

## 2018-10-03 DIAGNOSIS — E782 Mixed hyperlipidemia: Secondary | ICD-10-CM

## 2018-10-03 DIAGNOSIS — E785 Hyperlipidemia, unspecified: Secondary | ICD-10-CM | POA: Insufficient documentation

## 2018-10-03 LAB — CBC WITH DIFFERENTIAL/PLATELET
BASOS ABS: 0.1 10*3/uL (ref 0.0–0.2)
Basos: 1 %
EOS (ABSOLUTE): 0.3 10*3/uL (ref 0.0–0.4)
Eos: 3 %
HEMOGLOBIN: 15.7 g/dL (ref 13.0–17.7)
Hematocrit: 44.8 % (ref 37.5–51.0)
IMMATURE GRANS (ABS): 0 10*3/uL (ref 0.0–0.1)
Immature Granulocytes: 0 %
LYMPHS ABS: 2.5 10*3/uL (ref 0.7–3.1)
Lymphs: 24 %
MCH: 31 pg (ref 26.6–33.0)
MCHC: 35 g/dL (ref 31.5–35.7)
MCV: 89 fL (ref 79–97)
Monocytes Absolute: 1 10*3/uL — ABNORMAL HIGH (ref 0.1–0.9)
Monocytes: 10 %
NEUTROS ABS: 6.4 10*3/uL (ref 1.4–7.0)
Neutrophils: 62 %
Platelets: 278 10*3/uL (ref 150–450)
RBC: 5.06 x10E6/uL (ref 4.14–5.80)
RDW: 13.8 % (ref 12.3–15.4)
WBC: 10.2 10*3/uL (ref 3.4–10.8)

## 2018-10-03 LAB — LIPID PANEL
CHOLESTEROL TOTAL: 207 mg/dL — AB (ref 100–199)
Chol/HDL Ratio: 6.5 ratio — ABNORMAL HIGH (ref 0.0–5.0)
HDL: 32 mg/dL — ABNORMAL LOW (ref >39)
LDL CALC: 98 mg/dL (ref 0–99)
Triglycerides: 385 mg/dL — ABNORMAL HIGH (ref 0–149)
VLDL CHOLESTEROL CAL: 77 mg/dL — AB (ref 5–40)

## 2018-10-03 LAB — CMP14+EGFR
A/G RATIO: 1.7 (ref 1.2–2.2)
ALK PHOS: 69 IU/L (ref 39–117)
ALT: 19 IU/L (ref 0–44)
AST: 18 IU/L (ref 0–40)
Albumin: 4.6 g/dL (ref 3.5–5.5)
BILIRUBIN TOTAL: 0.3 mg/dL (ref 0.0–1.2)
BUN / CREAT RATIO: 15 (ref 9–20)
BUN: 12 mg/dL (ref 6–24)
CHLORIDE: 97 mmol/L (ref 96–106)
CO2: 23 mmol/L (ref 20–29)
Calcium: 9.2 mg/dL (ref 8.7–10.2)
Creatinine, Ser: 0.82 mg/dL (ref 0.76–1.27)
GFR calc non Af Amer: 102 mL/min/{1.73_m2} (ref >59)
GFR, EST AFRICAN AMERICAN: 118 mL/min/{1.73_m2} (ref >59)
GLUCOSE: 158 mg/dL — AB (ref 65–99)
Globulin, Total: 2.7 g/dL (ref 1.5–4.5)
POTASSIUM: 5.1 mmol/L (ref 3.5–5.2)
SODIUM: 141 mmol/L (ref 134–144)
TOTAL PROTEIN: 7.3 g/dL (ref 6.0–8.5)

## 2018-10-03 LAB — HIV ANTIBODY (ROUTINE TESTING W REFLEX): HIV SCREEN 4TH GENERATION: NONREACTIVE

## 2018-10-03 LAB — PSA, TOTAL AND FREE
PSA, Free Pct: 23.3 %
PSA, Free: 0.07 ng/mL
Prostate Specific Ag, Serum: 0.3 ng/mL (ref 0.0–4.0)

## 2018-10-03 LAB — TSH: TSH: 2.66 u[IU]/mL (ref 0.450–4.500)

## 2018-10-03 MED ORDER — SIMVASTATIN 40 MG PO TABS: 40.0000 mg | ORAL_TABLET | Freq: Every day | ORAL | 3 refills | Status: DC

## 2018-10-26 DIAGNOSIS — Z0000 Encounter for general adult medical examination without abnormal findings: Secondary | ICD-10-CM | POA: Diagnosis not present

## 2018-10-26 DIAGNOSIS — Z1212 Encounter for screening for malignant neoplasm of rectum: Secondary | ICD-10-CM | POA: Diagnosis not present

## 2018-10-26 DIAGNOSIS — Z1211 Encounter for screening for malignant neoplasm of colon: Secondary | ICD-10-CM | POA: Diagnosis not present

## 2018-11-01 LAB — COLOGUARD: COLOGUARD: NEGATIVE

## 2019-01-02 ENCOUNTER — Ambulatory Visit: Payer: BLUE CROSS/BLUE SHIELD | Admitting: Family Medicine

## 2019-01-02 DIAGNOSIS — J101 Influenza due to other identified influenza virus with other respiratory manifestations: Secondary | ICD-10-CM | POA: Diagnosis not present

## 2019-01-02 DIAGNOSIS — Z6834 Body mass index (BMI) 34.0-34.9, adult: Secondary | ICD-10-CM | POA: Diagnosis not present

## 2019-01-07 ENCOUNTER — Ambulatory Visit: Payer: BLUE CROSS/BLUE SHIELD | Admitting: Family Medicine

## 2019-01-28 ENCOUNTER — Ambulatory Visit: Payer: BLUE CROSS/BLUE SHIELD | Admitting: Family Medicine

## 2019-04-24 ENCOUNTER — Other Ambulatory Visit: Payer: Self-pay | Admitting: Family Medicine

## 2019-04-24 DIAGNOSIS — E782 Mixed hyperlipidemia: Secondary | ICD-10-CM

## 2019-05-08 ENCOUNTER — Ambulatory Visit: Payer: BLUE CROSS/BLUE SHIELD | Admitting: Family Medicine

## 2019-05-15 ENCOUNTER — Other Ambulatory Visit: Payer: Self-pay

## 2019-05-15 ENCOUNTER — Ambulatory Visit: Payer: BLUE CROSS/BLUE SHIELD | Admitting: Physician Assistant

## 2019-05-15 ENCOUNTER — Encounter: Payer: Self-pay | Admitting: Physician Assistant

## 2019-05-15 VITALS — BP 146/79 | HR 48 | Temp 98.1°F | Ht 73.0 in | Wt 262.6 lb

## 2019-05-15 DIAGNOSIS — R300 Dysuria: Secondary | ICD-10-CM

## 2019-05-15 DIAGNOSIS — N3001 Acute cystitis with hematuria: Secondary | ICD-10-CM

## 2019-05-15 DIAGNOSIS — Z87440 Personal history of urinary (tract) infections: Secondary | ICD-10-CM

## 2019-05-15 LAB — URINALYSIS, COMPLETE
Bilirubin, UA: NEGATIVE
Glucose, UA: NEGATIVE
Ketones, UA: NEGATIVE
Leukocytes,UA: NEGATIVE
Nitrite, UA: NEGATIVE
Protein,UA: NEGATIVE
Specific Gravity, UA: 1.025 (ref 1.005–1.030)
Urobilinogen, Ur: 0.2 mg/dL (ref 0.2–1.0)
pH, UA: 5 (ref 5.0–7.5)

## 2019-05-15 LAB — MICROSCOPIC EXAMINATION
Bacteria, UA: NONE SEEN
Epithelial Cells (non renal): NONE SEEN /hpf (ref 0–10)
Renal Epithel, UA: NONE SEEN /hpf
WBC, UA: NONE SEEN /hpf (ref 0–5)

## 2019-05-15 MED ORDER — SULFAMETHOXAZOLE-TRIMETHOPRIM 800-160 MG PO TABS
1.0000 | ORAL_TABLET | Freq: Two times a day (BID) | ORAL | 0 refills | Status: DC
Start: 2019-05-15 — End: 2019-07-22

## 2019-05-15 NOTE — Progress Notes (Signed)
BP (!) 146/79   Pulse (!) 48   Temp 98.1 F (36.7 C) (Oral)   Ht 6\' 1"  (1.854 m)   Wt 262 lb 9.6 oz (119.1 kg)   BMI 34.65 kg/m    Subjective:    Patient ID: Austin Smith, male    DOB: Jul 01, 1966, 53 y.o.   MRN: [id number]  HPI: Austin Smith is a 53 y.o. male presenting on 05/15/2019 for Dysuria  This patient has had a history of urinary tract infections, last one was in the fall and was found to have E. coli.  He is a [employer] architecttruck driver.  This time when he started to have symptoms he came in very quickly because he was afraid it would get bad again. This patient has had a couple of days of dysuria, frequency and nocturia. There is also pain over the bladder in the suprapubic region, no back pain. Denies leakage or hematuria.  Denies fever or chills. No pain in flank area.   Past Medical History:  Diagnosis Date  . Hyperlipidemia    Relevant past medical, surgical, family and social history reviewed and updated as indicated. Interim medical history since our last visit reviewed. Allergies and medications reviewed and updated. DATA REVIEWED: CHART IN EPIC  Family History reviewed for pertinent findings.  Review of Systems  Constitutional: Negative.  Negative for appetite change and fatigue.  Eyes: Negative for pain and visual disturbance.  Respiratory: Negative.  Negative for cough, chest tightness, shortness of breath and wheezing.   Cardiovascular: Negative.  Negative for chest pain, palpitations and leg swelling.  Gastrointestinal: Negative.  Negative for abdominal pain, diarrhea, nausea and vomiting.  Genitourinary: Positive for dysuria and frequency. Negative for flank pain and hematuria.  Skin: Negative.  Negative for color change and rash.  Neurological: Negative.  Negative for weakness, numbness and headaches.  Psychiatric/Behavioral: Negative.     Allergies as of 05/15/2019   No Known Allergies     Medication List       Accurate as of May 15, 2019 10:15 AM. If you  have any questions, ask your nurse or doctor.        simvastatin 40 MG tablet Commonly known as: ZOCOR TAKE ONE (1) TABLET EACH DAY   sulfamethoxazole-trimethoprim 800-160 MG tablet Commonly known as: Bactrim DS Take 1 tablet by mouth 2 (two) times daily. Started by: Remus LofflerAngel S Avree Szczygiel, PA-C          Objective:    BP (!) 146/79   Pulse (!) 48   Temp 98.1 F (36.7 C) (Oral)   Ht 6\' 1"  (1.854 m)   Wt 262 lb 9.6 oz (119.1 kg)   BMI 34.65 kg/m   No Known Allergies  Wt Readings from Last 3 Encounters:  05/15/19 262 lb 9.6 oz (119.1 kg)  10/02/18 273 lb 6 oz (124 kg)  08/13/18 267 lb (121.1 kg)    Physical Exam Vitals signs and nursing note reviewed.  Constitutional:      General: He is not in acute distress.    Appearance: He is well-developed.  HENT:     Head: Normocephalic and atraumatic.  Eyes:     Conjunctiva/sclera: Conjunctivae normal.     Pupils: Pupils are equal, round, and reactive to light.  Cardiovascular:     Rate and Rhythm: Normal rate and regular rhythm.  Pulmonary:     Effort: Pulmonary effort is normal. No respiratory distress.  Abdominal:     Palpations: Abdomen is soft.  Tenderness: There is no abdominal tenderness.  Skin:    General: Skin is warm and dry.  Psychiatric:        Behavior: Behavior normal.     Results for orders placed or performed in visit on 01/03/19  Cologuard  Result Value Ref Range   Cologuard Negative Negative      Assessment & Plan:   1. Dysuria - Urine Culture - Urinalysis, Complete  2. Acute cystitis with hematuria - sulfamethoxazole-trimethoprim (BACTRIM DS) 800-160 MG tablet; Take 1 tablet by mouth 2 (two) times daily.  Dispense: 20 tablet; Refill: 0  3. History of UTI - Urine Culture - Urinalysis, Complete - sulfamethoxazole-trimethoprim (BACTRIM DS) 800-160 MG tablet; Take 1 tablet by mouth 2 (two) times daily.  Dispense: 20 tablet; Refill: 0   Continue all other maintenance medications as listed  above.  Follow up plan: No follow-ups on file.  Educational handout given for uti  Terald Sleeper PA-C [facility] [address]  [place], [place] [address] [phone]   05/15/2019, 10:15 AM

## 2019-05-16 LAB — URINE CULTURE: Organism ID, Bacteria: NO GROWTH

## 2019-05-31 ENCOUNTER — Other Ambulatory Visit: Payer: Self-pay

## 2019-06-03 ENCOUNTER — Ambulatory Visit: Payer: BLUE CROSS/BLUE SHIELD | Admitting: Family Medicine

## 2019-06-17 ENCOUNTER — Telehealth: Payer: Self-pay | Admitting: Family Medicine

## 2019-06-17 ENCOUNTER — Other Ambulatory Visit: Payer: Self-pay | Admitting: *Deleted

## 2019-06-17 DIAGNOSIS — R7309 Other abnormal glucose: Secondary | ICD-10-CM

## 2019-06-17 DIAGNOSIS — Z6835 Body mass index (BMI) 35.0-35.9, adult: Secondary | ICD-10-CM

## 2019-06-17 DIAGNOSIS — E782 Mixed hyperlipidemia: Secondary | ICD-10-CM

## 2019-06-17 DIAGNOSIS — R311 Benign essential microscopic hematuria: Secondary | ICD-10-CM

## 2019-06-17 NOTE — Telephone Encounter (Signed)
Patient aware and appt made.  Orders placed

## 2019-06-17 NOTE — Telephone Encounter (Signed)
That will be fine. 

## 2019-07-08 ENCOUNTER — Other Ambulatory Visit: Payer: Self-pay

## 2019-07-08 ENCOUNTER — Other Ambulatory Visit: Payer: BLUE CROSS/BLUE SHIELD

## 2019-07-08 DIAGNOSIS — E1169 Type 2 diabetes mellitus with other specified complication: Secondary | ICD-10-CM

## 2019-07-08 DIAGNOSIS — E782 Mixed hyperlipidemia: Secondary | ICD-10-CM | POA: Diagnosis not present

## 2019-07-08 DIAGNOSIS — R311 Benign essential microscopic hematuria: Secondary | ICD-10-CM | POA: Diagnosis not present

## 2019-07-08 DIAGNOSIS — Z6835 Body mass index (BMI) 35.0-35.9, adult: Secondary | ICD-10-CM

## 2019-07-08 DIAGNOSIS — R7309 Other abnormal glucose: Secondary | ICD-10-CM

## 2019-07-08 DIAGNOSIS — E119 Type 2 diabetes mellitus without complications: Secondary | ICD-10-CM

## 2019-07-08 LAB — BAYER DCA HB A1C WAIVED: HB A1C (BAYER DCA - WAIVED): 7.5 % — ABNORMAL HIGH (ref <7.0)

## 2019-07-09 DIAGNOSIS — E119 Type 2 diabetes mellitus without complications: Secondary | ICD-10-CM | POA: Insufficient documentation

## 2019-07-09 LAB — CMP14+EGFR
ALT: 21 IU/L (ref 0–44)
AST: 22 IU/L (ref 0–40)
Albumin/Globulin Ratio: 2 (ref 1.2–2.2)
Albumin: 4.7 g/dL (ref 3.8–4.9)
Alkaline Phosphatase: 75 IU/L (ref 39–117)
BUN/Creatinine Ratio: 13 (ref 9–20)
BUN: 10 mg/dL (ref 6–24)
Bilirubin Total: 0.3 mg/dL (ref 0.0–1.2)
CO2: 26 mmol/L (ref 20–29)
Calcium: 9.6 mg/dL (ref 8.7–10.2)
Chloride: 97 mmol/L (ref 96–106)
Creatinine, Ser: 0.75 mg/dL — ABNORMAL LOW (ref 0.76–1.27)
GFR calc Af Amer: 122 mL/min/{1.73_m2} (ref >59)
GFR calc non Af Amer: 105 mL/min/{1.73_m2} (ref >59)
Globulin, Total: 2.3 g/dL (ref 1.5–4.5)
Glucose: 164 mg/dL — ABNORMAL HIGH (ref 65–99)
Potassium: 5.2 mmol/L (ref 3.5–5.2)
Sodium: 139 mmol/L (ref 134–144)
Total Protein: 7 g/dL (ref 6.0–8.5)

## 2019-07-09 LAB — CBC WITH DIFFERENTIAL/PLATELET
Basophils Absolute: 0.1 10*3/uL (ref 0.0–0.2)
Basos: 1 %
EOS (ABSOLUTE): 0.3 10*3/uL (ref 0.0–0.4)
Eos: 3 %
Hematocrit: 47 % (ref 37.5–51.0)
Hemoglobin: 15.5 g/dL (ref 13.0–17.7)
Immature Grans (Abs): 0.1 10*3/uL (ref 0.0–0.1)
Immature Granulocytes: 1 %
Lymphocytes Absolute: 2.5 10*3/uL (ref 0.7–3.1)
Lymphs: 26 %
MCH: 30.2 pg (ref 26.6–33.0)
MCHC: 33 g/dL (ref 31.5–35.7)
MCV: 92 fL (ref 79–97)
Monocytes Absolute: 1 10*3/uL — ABNORMAL HIGH (ref 0.1–0.9)
Monocytes: 10 %
Neutrophils Absolute: 5.7 10*3/uL (ref 1.4–7.0)
Neutrophils: 59 %
Platelets: 257 10*3/uL (ref 150–450)
RBC: 5.13 x10E6/uL (ref 4.14–5.80)
RDW: 13.2 % (ref 11.6–15.4)
WBC: 9.5 10*3/uL (ref 3.4–10.8)

## 2019-07-09 LAB — LIPID PANEL
Chol/HDL Ratio: 4.6 ratio (ref 0.0–5.0)
Cholesterol, Total: 155 mg/dL (ref 100–199)
HDL: 34 mg/dL — ABNORMAL LOW (ref >39)
LDL Chol Calc (NIH): 75 mg/dL (ref 0–99)
Triglycerides: 282 mg/dL — ABNORMAL HIGH (ref 0–149)
VLDL Cholesterol Cal: 46 mg/dL — ABNORMAL HIGH (ref 5–40)

## 2019-07-09 MED ORDER — METFORMIN HCL 500 MG PO TABS: 500.0000 mg | ORAL_TABLET | Freq: Two times a day (BID) | ORAL | 1 refills | Status: DC

## 2019-07-11 ENCOUNTER — Telehealth: Payer: Self-pay | Admitting: Family Medicine

## 2019-07-11 NOTE — Telephone Encounter (Signed)
There are millions of pts on Metformin and several studies concerning Metformin. The chances of cancer are limited per studies.

## 2019-07-11 NOTE — Telephone Encounter (Signed)
I had explained this to the patient already but he still wanted your opinion.  I told him what you said and he has agreed to try the Metformin.

## 2019-07-11 NOTE — Telephone Encounter (Signed)
Patient has been researching Metformin and is worried about taking it because of the history of recalls, the studies that have shown it can cause cancer.  He would like to know if there is something else he could do instead or another drug he could take instead.

## 2019-07-22 ENCOUNTER — Encounter: Payer: Self-pay | Admitting: Family Medicine

## 2019-07-22 ENCOUNTER — Other Ambulatory Visit: Payer: Self-pay

## 2019-07-22 ENCOUNTER — Ambulatory Visit (INDEPENDENT_AMBULATORY_CARE_PROVIDER_SITE_OTHER): Payer: BLUE CROSS/BLUE SHIELD | Admitting: Family Medicine

## 2019-07-22 DIAGNOSIS — I10 Essential (primary) hypertension: Secondary | ICD-10-CM

## 2019-07-22 DIAGNOSIS — Z6835 Body mass index (BMI) 35.0-35.9, adult: Secondary | ICD-10-CM

## 2019-07-22 DIAGNOSIS — E1169 Type 2 diabetes mellitus with other specified complication: Secondary | ICD-10-CM | POA: Diagnosis not present

## 2019-07-22 DIAGNOSIS — I152 Hypertension secondary to endocrine disorders: Secondary | ICD-10-CM | POA: Insufficient documentation

## 2019-07-22 DIAGNOSIS — E1159 Type 2 diabetes mellitus with other circulatory complications: Secondary | ICD-10-CM | POA: Diagnosis not present

## 2019-07-22 DIAGNOSIS — E119 Type 2 diabetes mellitus without complications: Secondary | ICD-10-CM | POA: Diagnosis not present

## 2019-07-22 DIAGNOSIS — R3129 Other microscopic hematuria: Secondary | ICD-10-CM

## 2019-07-22 DIAGNOSIS — E785 Hyperlipidemia, unspecified: Secondary | ICD-10-CM

## 2019-07-22 MED ORDER — ENALAPRIL MALEATE 2.5 MG PO TABS: 2.5000 mg | ORAL_TABLET | Freq: Every day | ORAL | 3 refills | Status: DC

## 2019-07-22 MED ORDER — SIMVASTATIN 40 MG PO TABS: ORAL_TABLET | ORAL | 3 refills | Status: DC

## 2019-07-22 NOTE — Progress Notes (Signed)
Virtual Visit via telephone Note Due to COVID-19 pandemic this visit was conducted virtually. This visit type was conducted due to national recommendations for restrictions regarding the COVID-19 Pandemic (e.g. social distancing, sheltering in place) in an effort to limit this patient's exposure and mitigate transmission in our community. All issues noted in this document were discussed and addressed.  A physical exam was not performed with this format.   I connected with Austin Smith on 07/22/19 at 0905 by telephone and verified that I am speaking with the correct person using two identifiers. Austin Smith is currently located at work and no one is currently with them during visit. The provider, Monia Pouch, FNP is located in their office at time of visit.  I discussed the limitations, risks, security and privacy concerns of performing an evaluation and management service by telephone and the availability of in person appointments. I also discussed with the patient that there may be a patient responsible charge related to this service. The patient expressed understanding and agreed to proceed.  Subjective:  Patient ID: Austin Smith, male    DOB: 1966-10-06, 53 y.o.   MRN: [id number]  Chief Complaint:  Medical Management of Chronic Issues, Hypertension, Hyperlipidemia, and Diabetes   HPI: Austin Smith is a 53 y.o. male presenting on 07/22/2019 for Medical Management of Chronic Issues, Hypertension, Hyperlipidemia, and Diabetes   Pt is following up for management of chronic medical conditions. 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 Pt states he has been doing well. He has initiated the metformin and changed his diet since last A1C was 7.5. Pt states he had diarrhea for the first few days after starting the metformin but that has subsided. Pt states he has last about 8 pounds due to diet. Pt states he feels  better. States blood sugar is averaging 128 when he checks it.   Hypertension This is a recurrent (Several readings of BP over 140/80, last BP at [facility] physical was 148/92.) problem. The problem has been gradually worsening since onset. Pertinent negatives include no anxiety, blurred vision, chest pain, headaches, malaise/fatigue, neck pain, orthopnea, palpitations, peripheral edema, PND, shortness of breath or sweats. Risk factors for coronary artery disease include diabetes mellitus, dyslipidemia, family history, obesity, male gender and sedentary lifestyle. Past treatments include nothing.  Hyperlipidemia This is a chronic problem. The current episode started more than 1 year ago. The problem is controlled. Recent lipid tests were reviewed and are variable. Exacerbating diseases include diabetes and obesity. Factors aggravating his hyperlipidemia include fatty foods. Pertinent negatives include no chest pain, focal sensory loss, focal weakness, leg pain, myalgias or shortness of breath. Current antihyperlipidemic treatment includes statins. The current treatment provides moderate improvement of lipids. Compliance problems include adherence to exercise.  Risk factors for coronary artery disease include dyslipidemia, diabetes mellitus, family history, hypertension, male sex, obesity and a sedentary lifestyle.  Diabetes He presents for his follow-up diabetic visit. He has type 2 diabetes mellitus. Pertinent negatives for hypoglycemia include no confusion, dizziness, headaches, hunger, mood changes, nervousness/anxiousness, pallor, seizures, sleepiness, speech difficulty, sweats or tremors. Pertinent negatives for diabetes include no blurred vision, no chest pain, no fatigue, no foot paresthesias, no foot ulcerations, no polydipsia, no polyphagia, no polyuria, no visual change, no weakness and no weight loss. Risk factors for coronary artery disease include dyslipidemia, hypertension, male sex, sedentary  lifestyle and obesity. Current diabetic treatment includes oral agent (monotherapy).  He is compliant with treatment all of the time. His weight is decreasing steadily. He is following a diabetic diet. When asked about meal planning, he reported none. He has not had a previous visit with a dietitian. He rarely participates in exercise. His home blood glucose trend is decreasing steadily. His breakfast blood glucose range is generally 110-130 mg/dl. He does not see a podiatrist.Eye exam is not current.     Relevant past medical, surgical, family, and social history reviewed and updated as indicated.  Allergies and medications reviewed and updated.   Past Medical History:  Diagnosis Date   Hyperlipidemia     History reviewed. No pertinent surgical history.  Social History   Socioeconomic History   Marital status: Married    Spouse name: Not on file   Number of children: Not on file   Years of education: Not on file   Highest education level: Not on file  Occupational History   Not on file  Social Needs   Financial resource strain: Not on file   Food insecurity    Worry: Not on file    Inability: Not on file   Transportation needs    Medical: Not on file    Non-medical: Not on file  Tobacco Use   Smoking status: Never Smoker   Smokeless tobacco: Never Used  Substance and Sexual Activity   Alcohol use: No   Drug use: No   Sexual activity: Yes    Birth control/protection: None  Lifestyle   Physical activity    Days per week: 3 days    Minutes per session: 20 min   Stress: Only a little  Relationships   Press photographer on phone: Not on file    Gets together: Not on file    Attends religious service: Not on file    Active member of club or organization: Not on file    Attends meetings of clubs or organizations: Not on file    Relationship status: Not on file   Intimate partner violence    Fear of current or ex partner: Not on file     Emotionally abused: Not on file    Physically abused: Not on file    Forced sexual activity: Not on file  Other Topics Concern   Not on file  Social History Narrative   Not on file    Outpatient Encounter Medications as of 07/22/2019  Medication Sig   enalapril (VASOTEC) 2.5 MG tablet Take 1 tablet (2.5 mg total) by mouth daily.   metFORMIN (GLUCOPHAGE) 500 MG tablet Take 1 tablet (500 mg total) by mouth 2 (two) times daily with a meal.   simvastatin (ZOCOR) 40 MG tablet TAKE ONE (1) TABLET EACH DAY   [DISCONTINUED] simvastatin (ZOCOR) 40 MG tablet TAKE ONE (1) TABLET EACH DAY   [DISCONTINUED] sulfamethoxazole-trimethoprim (BACTRIM DS) 800-160 MG tablet Take 1 tablet by mouth 2 (two) times daily.   No facility-administered encounter medications on file as of 07/22/2019.     No Known Allergies  Review of Systems  Constitutional: Negative for activity change, appetite change, chills, diaphoresis, fatigue, fever, malaise/fatigue, unexpected weight change and weight loss.  HENT: Negative.   Eyes: Negative.  Negative for blurred vision, photophobia and visual disturbance.  Respiratory: Negative for cough, chest tightness and shortness of breath.   Cardiovascular: Negative for chest pain, palpitations, orthopnea, leg swelling and PND.  Gastrointestinal: Negative for abdominal distention, abdominal pain, anal bleeding, blood in stool, constipation, diarrhea, nausea,  rectal pain and vomiting.  Endocrine: Negative.  Negative for cold intolerance, heat intolerance, polydipsia, polyphagia and polyuria.  Genitourinary: Negative for difficulty urinating, dysuria, frequency, hematuria and urgency.  Musculoskeletal: Negative for arthralgias, back pain, myalgias and neck pain.  Skin: Negative.  Negative for pallor.  Allergic/Immunologic: Negative.   Neurological: Negative for dizziness, tremors, focal weakness, seizures, syncope, facial asymmetry, speech difficulty, weakness, light-headedness,  numbness and headaches.  Hematological: Negative.   Psychiatric/Behavioral: Negative for confusion, hallucinations, sleep disturbance and suicidal ideas. The patient is not nervous/anxious.   All other systems reviewed and are negative.        Observations/Objective: No vital signs or physical exam, this was a telephone or virtual health encounter.  Pt alert and oriented, answers all questions appropriately, and able to speak in full sentences.    Assessment and Plan: Austin Smith was seen today for medical management of chronic issues, hypertension, hyperlipidemia and diabetes.  Diagnoses and all orders for this visit:  Type 2 diabetes mellitus without complication, without long-term current use of insulin ([name]) Continue metformin. Diet and exercise encouraged. Report any persistent high or low blood sugar readings. Follow up in 3 months for reevaluation. Discussed need for eye exam, vaccinations, and foot exam. Pt verbalized understanding.  -     CMP14+EGFR; Future -     Lipid panel; Future -     Microalbumin / creatinine urine ratio; Future -     Bayer DCA Hb A1c Waived; Future  Hyperlipidemia associated with type 2 diabetes mellitus ([place]) Diet and exercise encouraged. Cholesterol has improved. Will continue statin and recheck in 3-6 months.  -     simvastatin (ZOCOR) 40 MG tablet; TAKE ONE (1) TABLET EACH DAY -     CMP14+EGFR; Future -     Lipid panel; Future  Hypertension associated with type 2 diabetes mellitus ([place]) Last 3 readings have been elevated. Will initiate low dose ACEI today. Pt aware to report any persistent high or low readings. Will need EKG at next in office visit. Medications as prescribed. DASH diet and exercise encouraged.  -     enalapril (VASOTEC) 2.5 MG tablet; Take 1 tablet (2.5 mg total) by mouth daily. -     CMP14+EGFR; Future -     CBC with Differential/Platelet; Future -     Lipid panel; Future -     Thyroid Panel With TSH; Future -     Microalbumin /  creatinine urine ratio; Future  BMI 35.0-35.9,adult Has lost weight since initiating diet changes. Continue diet changes and start a regular exercise routine.  -     CMP14+EGFR; Future -     CBC with Differential/Platelet; Future -     Lipid panel; Future -     Thyroid Panel With TSH; Future    Follow Up Instructions: Return in about 3 months (around 10/21/2019), or if symptoms worsen or fail to improve, for DM.    I discussed the assessment and treatment plan with the patient. The patient was provided an opportunity to ask questions and all were answered. The patient agreed with the plan and demonstrated an understanding of the instructions.   The patient was advised to call back or seek an in-person evaluation if the symptoms worsen or if the condition fails to improve as anticipated.  The above assessment and management plan was discussed with the patient. The patient verbalized understanding of and has agreed to the management plan. Patient is aware to call the clinic if symptoms persist or worsen.  Patient is aware when to return to the clinic for a follow-up visit. Patient educated on when it is appropriate to go to the emergency department.    I provided 25 minutes of non-face-to-face time during this encounter. The call started at 0905. The call ended at 0930. The other time was used for coordination of care.    Monia Pouch, FNP-C [facility] [address] [place], [place] [address] [phone] 07/22/19

## 2019-09-26 ENCOUNTER — Other Ambulatory Visit: Payer: Self-pay | Admitting: *Deleted

## 2019-09-26 DIAGNOSIS — I152 Hypertension secondary to endocrine disorders: Secondary | ICD-10-CM

## 2019-09-26 DIAGNOSIS — E1159 Type 2 diabetes mellitus with other circulatory complications: Secondary | ICD-10-CM

## 2019-09-26 DIAGNOSIS — E119 Type 2 diabetes mellitus without complications: Secondary | ICD-10-CM

## 2019-09-26 DIAGNOSIS — E1169 Type 2 diabetes mellitus with other specified complication: Secondary | ICD-10-CM

## 2019-09-27 ENCOUNTER — Other Ambulatory Visit: Payer: Self-pay

## 2019-09-27 ENCOUNTER — Other Ambulatory Visit: Payer: BLUE CROSS/BLUE SHIELD

## 2019-09-27 DIAGNOSIS — E1169 Type 2 diabetes mellitus with other specified complication: Secondary | ICD-10-CM

## 2019-09-27 DIAGNOSIS — E119 Type 2 diabetes mellitus without complications: Secondary | ICD-10-CM

## 2019-09-27 DIAGNOSIS — I152 Hypertension secondary to endocrine disorders: Secondary | ICD-10-CM

## 2019-09-27 DIAGNOSIS — E1159 Type 2 diabetes mellitus with other circulatory complications: Secondary | ICD-10-CM

## 2019-09-27 DIAGNOSIS — E785 Hyperlipidemia, unspecified: Secondary | ICD-10-CM | POA: Diagnosis not present

## 2019-09-27 DIAGNOSIS — I10 Essential (primary) hypertension: Secondary | ICD-10-CM | POA: Diagnosis not present

## 2019-09-27 LAB — BAYER DCA HB A1C WAIVED: HB A1C (BAYER DCA - WAIVED): 6.1 % (ref <7.0)

## 2019-09-28 LAB — CMP14+EGFR
ALT: 13 IU/L (ref 0–44)
AST: 14 IU/L (ref 0–40)
Albumin/Globulin Ratio: 2 (ref 1.2–2.2)
Albumin: 4.7 g/dL (ref 3.8–4.9)
Alkaline Phosphatase: 81 IU/L (ref 39–117)
BUN/Creatinine Ratio: 15 (ref 9–20)
BUN: 12 mg/dL (ref 6–24)
Bilirubin Total: 0.5 mg/dL (ref 0.0–1.2)
CO2: 24 mmol/L (ref 20–29)
Calcium: 9.6 mg/dL (ref 8.7–10.2)
Chloride: 98 mmol/L (ref 96–106)
Creatinine, Ser: 0.82 mg/dL (ref 0.76–1.27)
GFR calc Af Amer: 117 mL/min/{1.73_m2} (ref >59)
GFR calc non Af Amer: 101 mL/min/{1.73_m2} (ref >59)
Globulin, Total: 2.4 g/dL (ref 1.5–4.5)
Glucose: 94 mg/dL (ref 65–99)
Potassium: 4.7 mmol/L (ref 3.5–5.2)
Sodium: 141 mmol/L (ref 134–144)
Total Protein: 7.1 g/dL (ref 6.0–8.5)

## 2019-09-28 LAB — LIPID PANEL
Chol/HDL Ratio: 4.8 ratio (ref 0.0–5.0)
Cholesterol, Total: 144 mg/dL (ref 100–199)
HDL: 30 mg/dL — ABNORMAL LOW (ref >39)
LDL Chol Calc (NIH): 74 mg/dL (ref 0–99)
Triglycerides: 239 mg/dL — ABNORMAL HIGH (ref 0–149)
VLDL Cholesterol Cal: 40 mg/dL (ref 5–40)

## 2019-09-28 LAB — CBC WITH DIFFERENTIAL/PLATELET
Basophils Absolute: 0.1 10*3/uL (ref 0.0–0.2)
Basos: 1 %
EOS (ABSOLUTE): 0.2 10*3/uL (ref 0.0–0.4)
Eos: 2 %
Hematocrit: 43.2 % (ref 37.5–51.0)
Hemoglobin: 14.6 g/dL (ref 13.0–17.7)
Immature Grans (Abs): 0 10*3/uL (ref 0.0–0.1)
Immature Granulocytes: 0 %
Lymphocytes Absolute: 2.3 10*3/uL (ref 0.7–3.1)
Lymphs: 23 %
MCH: 30.4 pg (ref 26.6–33.0)
MCHC: 33.8 g/dL (ref 31.5–35.7)
MCV: 90 fL (ref 79–97)
Monocytes Absolute: 0.8 10*3/uL (ref 0.1–0.9)
Monocytes: 8 %
Neutrophils Absolute: 6.8 10*3/uL (ref 1.4–7.0)
Neutrophils: 66 %
Platelets: 254 10*3/uL (ref 150–450)
RBC: 4.8 x10E6/uL (ref 4.14–5.80)
RDW: 13 % (ref 11.6–15.4)
WBC: 10.2 10*3/uL (ref 3.4–10.8)

## 2019-10-02 ENCOUNTER — Encounter: Payer: Self-pay | Admitting: Family Medicine

## 2019-10-02 ENCOUNTER — Ambulatory Visit (INDEPENDENT_AMBULATORY_CARE_PROVIDER_SITE_OTHER): Payer: BLUE CROSS/BLUE SHIELD | Admitting: Family Medicine

## 2019-10-02 ENCOUNTER — Ambulatory Visit: Payer: BLUE CROSS/BLUE SHIELD | Admitting: Family Medicine

## 2019-10-02 DIAGNOSIS — I10 Essential (primary) hypertension: Secondary | ICD-10-CM

## 2019-10-02 DIAGNOSIS — E1169 Type 2 diabetes mellitus with other specified complication: Secondary | ICD-10-CM

## 2019-10-02 DIAGNOSIS — E119 Type 2 diabetes mellitus without complications: Secondary | ICD-10-CM

## 2019-10-02 DIAGNOSIS — I152 Hypertension secondary to endocrine disorders: Secondary | ICD-10-CM

## 2019-10-02 DIAGNOSIS — E1159 Type 2 diabetes mellitus with other circulatory complications: Secondary | ICD-10-CM

## 2019-10-02 DIAGNOSIS — E785 Hyperlipidemia, unspecified: Secondary | ICD-10-CM

## 2019-10-02 MED ORDER — ENALAPRIL MALEATE 2.5 MG PO TABS: 2.5000 mg | ORAL_TABLET | Freq: Every day | ORAL | 1 refills | Status: DC

## 2019-10-02 MED ORDER — SIMVASTATIN 40 MG PO TABS: ORAL_TABLET | ORAL | 3 refills | Status: DC

## 2019-10-02 MED ORDER — METFORMIN HCL 500 MG PO TABS: 500.0000 mg | ORAL_TABLET | Freq: Two times a day (BID) | ORAL | 1 refills | Status: DC

## 2019-10-02 NOTE — Progress Notes (Signed)
Virtual Visit via telephone Note Due to COVID-19 pandemic this visit was conducted virtually. This visit type was conducted due to national recommendations for restrictions regarding the COVID-19 Pandemic (e.g. social distancing, sheltering in place) in an effort to limit this patient's exposure and mitigate transmission in our community. All issues noted in this document were discussed and addressed.  A physical exam was not performed with this format.   I connected with Austin Smith on 10/02/2019 at [facility] by telephone and verified that I am speaking with the correct person using two identifiers. Austin Smith is currently located at home and family is currently with them during visit. The provider, Monia Pouch, FNP is located in their office at time of visit.  I discussed the limitations, risks, security and privacy concerns of performing an evaluation and management service by telephone and the availability of in person appointments. I also discussed with the patient that there may be a patient responsible charge related to this service. The patient expressed understanding and agreed to proceed.  Subjective:  Patient ID: Austin Smith, male    DOB: Aug 29, 1966, 53 y.o.   MRN: [id number]  Chief Complaint:  Medical Management of Chronic Issues, Diabetes, Hypertension, and Hyperlipidemia   HPI: Austin Smith is a 53 y.o. male presenting on 10/02/2019 for Medical Management of Chronic Issues, Diabetes, Hypertension, and Hyperlipidemia   Diabetes He presents for his follow-up diabetic visit. He has type 2 diabetes mellitus. His disease course has been improving. There are no hypoglycemic associated symptoms. Pertinent negatives for hypoglycemia include no confusion, dizziness, headaches, seizures, speech difficulty, sweats or tremors. Pertinent negatives for diabetes include no blurred vision, no chest pain, no fatigue, no foot paresthesias, no foot ulcerations, no polydipsia, no polyphagia, no  polyuria, no visual change, no weakness and no weight loss. Diabetic complications include heart disease. Pertinent negatives for diabetic complications include no CVA, PVD or retinopathy. Risk factors for coronary artery disease include diabetes mellitus, dyslipidemia, hypertension, male sex, obesity and sedentary lifestyle. Current diabetic treatment includes oral agent (monotherapy) and diet. He is compliant with treatment all of the time. His weight is decreasing steadily. He is following a diabetic and generally healthy diet. Meal planning includes avoidance of concentrated sweets. He has not had a previous visit with a dietitian. He rarely participates in exercise. His home blood glucose trend is decreasing steadily. An ACE inhibitor/angiotensin II receptor blocker is being taken.  Hypertension This is a recurrent problem. The current episode started more than 1 month ago. The problem is controlled. Pertinent negatives include no anxiety, blurred vision, chest pain, headaches, malaise/fatigue, neck pain, orthopnea, palpitations, peripheral edema, PND, shortness of breath or sweats. Risk factors for coronary artery disease include dyslipidemia, diabetes mellitus, obesity, male gender and sedentary lifestyle. Past treatments include ACE inhibitors. The current treatment provides significant improvement. Compliance problems include exercise.  There is no history of angina, kidney disease, CAD/MI, CVA, heart failure, left ventricular hypertrophy, PVD or retinopathy.  Hyperlipidemia This is a recurrent problem. The current episode started more than 1 month ago. The problem is uncontrolled. Recent lipid tests were reviewed and are high. Exacerbating diseases include diabetes and obesity. Factors aggravating his hyperlipidemia include fatty foods. Pertinent negatives include no chest pain, focal sensory loss, focal weakness, leg pain, myalgias or shortness of breath. Current antihyperlipidemic treatment includes  diet change and statins. The current treatment provides moderate improvement of lipids. There are no compliance problems.  Risk factors for coronary artery disease include dyslipidemia, diabetes mellitus, hypertension,  male sex, obesity and a sedentary lifestyle.     Relevant past medical, surgical, family, and social history reviewed and updated as indicated.  Allergies and medications reviewed and updated.   Past Medical History:  Diagnosis Date   Hyperlipidemia     History reviewed. No pertinent surgical history.  Social History   Socioeconomic History   Marital status: Married    Spouse name: Not on file   Number of children: Not on file   Years of education: Not on file   Highest education level: Not on file  Occupational History   Not on file  Social Needs   Financial resource strain: Not on file   Food insecurity    Worry: Not on file    Inability: Not on file   Transportation needs    Medical: Not on file    Non-medical: Not on file  Tobacco Use   Smoking status: Never Smoker   Smokeless tobacco: Never Used  Substance and Sexual Activity   Alcohol use: No   Drug use: No   Sexual activity: Yes    Birth control/protection: None  Lifestyle   Physical activity    Days per week: 3 days    Minutes per session: 20 min   Stress: Only a little  Relationships   Event organiserocial connections    Talks on phone: Not on file    Gets together: Not on file    Attends religious service: Not on file    Active member of club or organization: Not on file    Attends meetings of clubs or organizations: Not on file    Relationship status: Not on file   Intimate partner violence    Fear of current or ex partner: Not on file    Emotionally abused: Not on file    Physically abused: Not on file    Forced sexual activity: Not on file  Other Topics Concern   Not on file  Social History Narrative   Not on file    Outpatient Encounter Medications as of 10/02/2019    Medication Sig   enalapril (VASOTEC) 2.5 MG tablet Take 1 tablet (2.5 mg total) by mouth daily.   metFORMIN (GLUCOPHAGE) 500 MG tablet Take 1 tablet (500 mg total) by mouth 2 (two) times daily with a meal.   simvastatin (ZOCOR) 40 MG tablet TAKE ONE (1) TABLET EACH DAY   [DISCONTINUED] enalapril (VASOTEC) 2.5 MG tablet Take 1 tablet (2.5 mg total) by mouth daily.   [DISCONTINUED] metFORMIN (GLUCOPHAGE) 500 MG tablet Take 1 tablet (500 mg total) by mouth 2 (two) times daily with a meal.   [DISCONTINUED] simvastatin (ZOCOR) 40 MG tablet TAKE ONE (1) TABLET EACH DAY   No facility-administered encounter medications on file as of 10/02/2019.     No Known Allergies  Review of Systems  Constitutional: Negative for activity change, appetite change, chills, diaphoresis, fatigue, fever, malaise/fatigue, unexpected weight change and weight loss.  HENT: Negative.   Eyes: Negative.  Negative for blurred vision, photophobia and visual disturbance.  Respiratory: Negative for cough, chest tightness and shortness of breath.   Cardiovascular: Negative for chest pain, palpitations, orthopnea, leg swelling and PND.  Gastrointestinal: Negative for abdominal pain, blood in stool, constipation, diarrhea, nausea and vomiting.  Endocrine: Negative.  Negative for polydipsia, polyphagia and polyuria.  Genitourinary: Negative for decreased urine volume, difficulty urinating, dysuria, frequency and urgency.  Musculoskeletal: Negative for arthralgias, myalgias and neck pain.  Skin: Negative.   Allergic/Immunologic: Negative.   Neurological:  Negative for dizziness, tremors, focal weakness, seizures, syncope, facial asymmetry, speech difficulty, weakness, light-headedness, numbness and headaches.  Hematological: Negative.   Psychiatric/Behavioral: Negative for confusion, hallucinations, sleep disturbance and suicidal ideas.  All other systems reviewed and are negative.        Observations/Objective: No  vital signs or physical exam, this was a telephone or virtual health encounter.  Pt alert and oriented, answers all questions appropriately, and able to speak in full sentences.    Assessment and Plan: Shin was seen today for medical management of chronic issues, diabetes, hypertension and hyperlipidemia.  Diagnoses and all orders for this visit:  Type 2 diabetes mellitus without complication, without long-term current use of insulin ([name]) A1C 6.1, doing great. Keep up the good work. Continue with diet and exercise. Continue medications as prescribed. Follow up in office in 3 months.  -     metFORMIN (GLUCOPHAGE) 500 MG tablet; Take 1 tablet (500 mg total) by mouth 2 (two) times daily with a meal.  Hyperlipidemia associated with type 2 diabetes mellitus ([name]) Triglycerides remain elevated. Diet discussed in detail. Pt aware of changes to make. Continue simvastatin. Diet and exercise are essential. Will recheck in 6-9 months.  -     simvastatin (ZOCOR) 40 MG tablet; TAKE ONE (1) TABLET EACH DAY  Hypertension associated with type 2 diabetes mellitus ([name]) Has not been checking at home. Pt aware to check at random and report any persistent high reading. Continue below.  -     enalapril (VASOTEC) 2.5 MG tablet; Take 1 tablet (2.5 mg total) by mouth daily.  Morbid obesity ([name]) Diet and exercise encouraged. Has lost 6 pounds or so. Continue the good work.     Follow Up Instructions: Return in about 3 months (around 01/02/2020), or if symptoms worsen or fail to improve, for DM.    I discussed the assessment and treatment plan with the patient. The patient was provided an opportunity to ask questions and all were answered. The patient agreed with the plan and demonstrated an understanding of the instructions.   The patient was advised to call back or seek an in-person evaluation if the symptoms worsen or if the condition fails to improve as anticipated.  The above assessment and  management plan was discussed with the patient. The patient verbalized understanding of and has agreed to the management plan. Patient is aware to call the clinic if they develop any new symptoms or if symptoms persist or worsen. Patient is aware when to return to the clinic for a follow-up visit. Patient educated on when it is appropriate to go to the emergency department.    I provided 25 minutes of non-face-to-face time during this encounter. The call started at 0845. The call ended at 0910. The other time was used for coordination of care.    Kari Baars, FNP-C [facility] [address] [place], [place] [address] [phone] 10/02/2019

## 2020-03-13 ENCOUNTER — Ambulatory Visit: Payer: BLUE CROSS/BLUE SHIELD | Admitting: Nurse Practitioner

## 2020-03-13 ENCOUNTER — Encounter: Payer: Self-pay | Admitting: Nurse Practitioner

## 2020-03-13 ENCOUNTER — Other Ambulatory Visit: Payer: Self-pay

## 2020-03-13 VITALS — BP 156/72 | HR 40 | Temp 96.8°F | Resp 20 | Ht 73.0 in | Wt 247.0 lb

## 2020-03-13 DIAGNOSIS — R300 Dysuria: Secondary | ICD-10-CM

## 2020-03-13 LAB — MICROSCOPIC EXAMINATION
Bacteria, UA: NONE SEEN
RBC, Urine: NONE SEEN /hpf (ref 0–2)
Renal Epithel, UA: NONE SEEN /hpf

## 2020-03-13 LAB — URINALYSIS, COMPLETE
Bilirubin, UA: NEGATIVE
Glucose, UA: NEGATIVE
Ketones, UA: NEGATIVE
Nitrite, UA: NEGATIVE
Protein,UA: NEGATIVE
Specific Gravity, UA: 1.025 (ref 1.005–1.030)
Urobilinogen, Ur: 1 mg/dL (ref 0.2–1.0)
pH, UA: 5.5 (ref 5.0–7.5)

## 2020-03-13 NOTE — Progress Notes (Signed)
Established Patient Office Visit  Subjective:  Patient ID: Austin Smith, male    DOB: Apr 29, 1966  Age: 54 y.o. MRN: [id number]  CC:  Chief Complaint  Patient presents with  . Urinary Tract Infection    burning, urgency, dark colored     HPI Austin Smith presents for UTI symptoms that started about 3 days ago, patient reports slight  burning, dark color urine, mild nausea, and low grade fever. No CVA tenderness, vomiting or pelvic pain. He reports not drinking enough water due to the nature of his job and not having access to the rest room as quickly as possible. He has experienced  Similar symptoms in the past and wanted to come to clinic to day to get checked for Urinary infection.  Past Medical History:  Diagnosis Date  . Hyperlipidemia     History reviewed. No pertinent surgical history.  Family History  Problem Relation Age of Onset  . Heart disease Mother        atrial fibrillation  . Cancer Paternal Aunt   . Cancer Maternal Grandmother        breast  . Early death Paternal Grandmother 61       myocardial in  . Alzheimer's disease Paternal Grandfather   . Kidney Stones Brother   . Thyroid disease Brother        produce too much calcium which cause kidney stones    Social History   Socioeconomic History  . Marital status: Married    Spouse name: Not on file  . Number of children: Not on file  . Years of education: Not on file  . Highest education level: Not on file  Occupational History  . Not on file  Tobacco Use  . Smoking status: Never Smoker  . Smokeless tobacco: Never Used  Substance and Sexual Activity  . Alcohol use: No  . Drug use: No  . Sexual activity: Yes    Birth control/protection: None  Other Topics Concern  . Not on file  Social History Narrative  . Not on file   Social Determinants of Health   Financial Resource Strain:   . Difficulty of Paying Living Expenses:   Food Insecurity:   . Worried About Programme researcher, broadcasting/film/video in the Last  Year:   . Barista in the Last Year:   Transportation Needs:   . Freight forwarder (Medical):   Marland Kitchen Lack of Transportation (Non-Medical):   Physical Activity:   . Days of Exercise per Week:   . Minutes of Exercise per Session:   Stress:   . Feeling of Stress :   Social Connections:   . Frequency of Communication with Friends and Family:   . Frequency of Social Gatherings with Friends and Family:   . Attends Religious Services:   . Active Member of Clubs or Organizations:   . Attends Banker Meetings:   Marland Kitchen Marital Status:   Intimate Partner Violence:   . Fear of Current or Ex-Partner:   . Emotionally Abused:   Marland Kitchen Physically Abused:   . Sexually Abused:     Outpatient Medications Prior to Visit  Medication Sig Dispense Refill  . enalapril (VASOTEC) 2.5 MG tablet Take 1 tablet (2.5 mg total) by mouth daily. 90 tablet 1  . metFORMIN (GLUCOPHAGE) 500 MG tablet Take 1 tablet (500 mg total) by mouth 2 (two) times daily with a meal. 180 tablet 1  . simvastatin (ZOCOR) 40 MG tablet TAKE ONE (1) TABLET  EACH DAY 90 tablet 3   No facility-administered medications prior to visit.    No Known Allergies  ROS Review of Systems  Constitutional: Positive for fever. Negative for activity change, appetite change and chills.  Respiratory: Negative for chest tightness and shortness of breath.   Cardiovascular: Negative for palpitations.  Gastrointestinal: Positive for nausea. Negative for abdominal pain and vomiting.  Genitourinary: Positive for difficulty urinating.  Skin: Negative for color change and rash.      Objective:    Physical Exam  Constitutional: He is oriented to person, place, and time. He appears well-developed.  HENT:  Head: Normocephalic.  Eyes: Conjunctivae are normal.  Cardiovascular: Normal rate.  Pulmonary/Chest: Breath sounds normal.  Abdominal: Bowel sounds are normal.  Mild nausea  Genitourinary:    Genitourinary Comments: Burning with  urination   Musculoskeletal:        General: No tenderness.     Cervical back: Neck supple.  Neurological: He is oriented to person, place, and time.  Skin: Skin is warm. No rash noted.  Psychiatric: He has a normal mood and affect. His behavior is normal.    BP (!) 156/72 (BP Location: Right Arm, Cuff Size: Large)   Pulse (!) 40   Temp (!) 96.8 F (36 C)   Resp 20   Ht 6\' 1"  (1.854 m)   Wt 247 lb (112 kg)   SpO2 100%   BMI 32.59 kg/m  Wt Readings from Last 3 Encounters:  03/13/20 247 lb (112 kg)  05/15/19 262 lb 9.6 oz (119.1 kg)  10/02/18 273 lb 6 oz (124 kg)     Health Maintenance Due  Topic Date Due  . PNEUMOCOCCAL POLYSACCHARIDE VACCINE AGE 86-64 HIGH RISK  Never done  . FOOT EXAM  Never done  . OPHTHALMOLOGY EXAM  Never done  . URINE MICROALBUMIN  Never done  . COVID-19 Vaccine (1) Never done  . TETANUS/TDAP  Never done  . COLONOSCOPY  Never done      Lab Results  Component Value Date   TSH 2.660 10/02/2018   Lab Results  Component Value Date   WBC 10.2 09/27/2019   HGB 14.6 09/27/2019   HCT 43.2 09/27/2019   MCV 90 09/27/2019   PLT 254 09/27/2019   Lab Results  Component Value Date   NA 141 09/27/2019   K 4.7 09/27/2019   CO2 24 09/27/2019   GLUCOSE 94 09/27/2019   BUN 12 09/27/2019   CREATININE 0.82 09/27/2019   BILITOT 0.5 09/27/2019   ALKPHOS 81 09/27/2019   AST 14 09/27/2019   ALT 13 09/27/2019   PROT 7.1 09/27/2019   ALBUMIN 4.7 09/27/2019   CALCIUM 9.6 09/27/2019   ANIONGAP 11 06/16/2017   Lab Results  Component Value Date   CHOL 144 09/27/2019   Lab Results  Component Value Date   HDL 30 (L) 09/27/2019   Lab Results  Component Value Date   LDLCALC 74 09/27/2019   Lab Results  Component Value Date   TRIG 239 (H) 09/27/2019   Lab Results  Component Value Date   CHOLHDL 4.8 09/27/2019   Lab Results  Component Value Date   HGBA1C 6.1 09/27/2019      Assessment & Plan:   Problem List Items Addressed This Visit        Other   Dysuria - Primary    Patients symptoms are not well controlled, UA completed. Mucus thread present with trace LEU and BLO not significant for UTI diagnosis, Urine sent  to culture, patient will provide dirty urine for further testing based on results, treatment will follow appropriately. Educated to increase fluid and use tylenol for mild pain as needed.      Relevant Orders   Urine Culture   Urinalysis, Complete        Follow-up: Return if symptoms worsen or fail to improve.    Ivy Lynn, NP

## 2020-03-13 NOTE — Patient Instructions (Addendum)
Patients symptoms are not well controlled, UA completed. Mucus thread present with trace LEU and BLO not significant for UTI diagnosis, Urine sent to culture, patient will provide dirty urine for further testing based on results, treatment will follow appropriately. Educated to increase fluid and use tylenol for mild pain as needed.   Acute Urinary Retention, Male  Acute urinary retention means that you cannot pee (urinate) at all, or that you pee too little and your bladder is not emptied completely. If it is not treated, it can lead to kidney damage or other serious problems. Follow these instructions at home:  Take over-the-counter and prescription medicines only as told by your doctor. Ask your doctor what medicines you should stay away from. Do not take any medicine unless your doctor says it is okay to do so.  If you were sent home with a tube that drains the bladder (catheter), take care of it as told by your doctor.  Drink enough fluid to keep your pee clear or pale yellow.  If you were given an antibiotic, take it as told by your doctor. Do not stop taking the antibiotic even if you start to feel better.  Do not use any products that contain nicotine or tobacco, such as cigarettes and e-cigarettes. If you need help quitting, ask your doctor.  Watch for changes in your symptoms. Tell your doctor about them.  If told, track changes in your blood pressure at home. Tell your doctor about them.  Keep all follow-up visits as told by your doctor. This is important. Contact a doctor if:  You have spasms or you leak pee when you have spasms. Get help right away if:  You have chills or a fever.  You have a tube that drains the bladder and: ? The tube stops draining pee. ? The tube falls out.  You have blood in your pee. Summary  Acute urinary retention means that you have problems peeing. It may mean that you cannot pee at all, or that you pee too little.  If this condition is  not treated, it can lead to kidney damage or other serious problems.  If you were sent home with a tube that drains the bladder, take care of it as told by your doctor.  Monitor any changes in your symptoms. Tell your doctor about any changes. This information is not intended to replace advice given to you by your health care provider. Make sure you discuss any questions you have with your health care provider. Document Revised: 01/10/2019 Document Reviewed: 11/25/2016 Elsevier Patient Education  2020 [employer].

## 2020-03-13 NOTE — Assessment & Plan Note (Signed)
Patients symptoms are not well controlled, UA completed. Mucus thread present with trace LEU and BLO not significant for UTI diagnosis, Urine sent to culture, patient will provide dirty urine for further testing based on results, treatment will follow appropriately. Educated to increase fluid and use tylenol for mild pain as needed.

## 2020-03-14 LAB — URINE CULTURE: Organism ID, Bacteria: NO GROWTH

## 2020-04-19 DIAGNOSIS — I10 Essential (primary) hypertension: Secondary | ICD-10-CM | POA: Diagnosis not present

## 2020-04-19 DIAGNOSIS — M546 Pain in thoracic spine: Secondary | ICD-10-CM | POA: Diagnosis not present

## 2020-04-19 DIAGNOSIS — S39012A Strain of muscle, fascia and tendon of lower back, initial encounter: Secondary | ICD-10-CM | POA: Diagnosis not present

## 2020-05-15 ENCOUNTER — Telehealth: Payer: Self-pay | Admitting: Nurse Practitioner

## 2020-05-15 NOTE — Telephone Encounter (Signed)
Will discus when he comes in for physical. Depends on his medical history and his blood pressure

## 2020-05-15 NOTE — Telephone Encounter (Signed)
Reply to question

## 2020-05-18 NOTE — Telephone Encounter (Signed)
Attempted to contact patient - NA °

## 2020-05-26 NOTE — Telephone Encounter (Signed)
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  This encounter will now be closed

## 2020-09-14 ENCOUNTER — Ambulatory Visit: Payer: BC Managed Care – PPO | Admitting: Nurse Practitioner

## 2020-09-14 ENCOUNTER — Encounter: Payer: Self-pay | Admitting: Nurse Practitioner

## 2020-09-14 ENCOUNTER — Other Ambulatory Visit: Payer: Self-pay

## 2020-09-14 VITALS — BP 141/63 | HR 51 | Temp 97.9°F | Resp 20 | Ht 73.0 in | Wt 242.0 lb

## 2020-09-14 DIAGNOSIS — Z125 Encounter for screening for malignant neoplasm of prostate: Secondary | ICD-10-CM | POA: Diagnosis not present

## 2020-09-14 DIAGNOSIS — E119 Type 2 diabetes mellitus without complications: Secondary | ICD-10-CM

## 2020-09-14 DIAGNOSIS — E1159 Type 2 diabetes mellitus with other circulatory complications: Secondary | ICD-10-CM

## 2020-09-14 DIAGNOSIS — E1169 Type 2 diabetes mellitus with other specified complication: Secondary | ICD-10-CM

## 2020-09-14 DIAGNOSIS — I152 Hypertension secondary to endocrine disorders: Secondary | ICD-10-CM

## 2020-09-14 DIAGNOSIS — E785 Hyperlipidemia, unspecified: Secondary | ICD-10-CM

## 2020-09-14 LAB — BAYER DCA HB A1C WAIVED: HB A1C (BAYER DCA - WAIVED): 5.7 % (ref <7.0)

## 2020-09-14 MED ORDER — SIMVASTATIN 40 MG PO TABS: ORAL_TABLET | ORAL | 1 refills | Status: DC

## 2020-09-14 MED ORDER — SILDENAFIL CITRATE 20 MG PO TABS: 20.0000 mg | ORAL_TABLET | Freq: Three times a day (TID) | ORAL | 2 refills | Status: DC

## 2020-09-14 NOTE — Patient Instructions (Signed)
DASH Eating Plan DASH stands for "Dietary Approaches to Stop Hypertension." The DASH eating plan is a healthy eating plan that has been shown to reduce high blood pressure (hypertension). It may also reduce your risk for type 2 diabetes, heart disease, and stroke. The DASH eating plan may also help with weight loss. What are tips for following this plan?  General guidelines  Avoid eating more than 2,300 mg (milligrams) of salt (sodium) a day. If you have hypertension, you may need to reduce your sodium intake to 1,500 mg a day.  Limit alcohol intake to no more than 1 drink a day for nonpregnant women and 2 drinks a day for men. One drink equals 12 oz of beer, 5 oz of wine, or 1 oz of hard liquor.  Work with your health care provider to maintain a healthy body weight or to lose weight. Ask what an ideal weight is for you.  Get at least 30 minutes of exercise that causes your heart to beat faster (aerobic exercise) most days of the week. Activities may include walking, swimming, or biking.  Work with your health care provider or diet and nutrition specialist (dietitian) to adjust your eating plan to your individual calorie needs. Reading food labels   Check food labels for the amount of sodium per serving. Choose foods with less than 5 percent of the Daily Value of sodium. Generally, foods with less than 300 mg of sodium per serving fit into this eating plan.  To find whole grains, look for the word "whole" as the first word in the ingredient list. Shopping  Buy products labeled as "low-sodium" or "no salt added."  Buy fresh foods. Avoid canned foods and premade or frozen meals. Cooking  Avoid adding salt when cooking. Use salt-free seasonings or herbs instead of table salt or sea salt. Check with your health care provider or pharmacist before using salt substitutes.  Do not fry foods. Cook foods using healthy methods such as baking, boiling, grilling, and broiling instead.  Cook with  heart-healthy oils, such as olive, canola, soybean, or sunflower oil. Meal planning  Eat a balanced diet that includes: ? 5 or more servings of fruits and vegetables each day. At each meal, try to fill half of your plate with fruits and vegetables. ? Up to 6-8 servings of whole grains each day. ? Less than 6 oz of lean meat, poultry, or fish each day. A 3-oz serving of meat is about the same size as a deck of cards. One egg equals 1 oz. ? 2 servings of low-fat dairy each day. ? A serving of nuts, seeds, or beans 5 times each week. ? Heart-healthy fats. Healthy fats called Omega-3 fatty acids are found in foods such as flaxseeds and coldwater fish, like sardines, salmon, and mackerel.  Limit how much you eat of the following: ? Canned or prepackaged foods. ? Food that is high in trans fat, such as fried foods. ? Food that is high in saturated fat, such as fatty meat. ? Sweets, desserts, sugary drinks, and other foods with added sugar. ? Full-fat dairy products.  Do not salt foods before eating.  Try to eat at least 2 vegetarian meals each week.  Eat more home-cooked food and less restaurant, buffet, and fast food.  When eating at a restaurant, ask that your food be prepared with less salt or no salt, if possible. What foods are recommended? The items listed may not be a complete list. Talk with your dietitian about   what dietary choices are best for you. Grains Whole-grain or whole-wheat bread. Whole-grain or whole-wheat pasta. Brown rice. Oatmeal. Quinoa. Bulgur. Whole-grain and low-sodium cereals. Pita bread. Low-fat, low-sodium crackers. Whole-wheat flour tortillas. Vegetables Fresh or frozen vegetables (raw, steamed, roasted, or grilled). Low-sodium or reduced-sodium tomato and vegetable juice. Low-sodium or reduced-sodium tomato sauce and tomato paste. Low-sodium or reduced-sodium canned vegetables. Fruits All fresh, dried, or frozen fruit. Canned fruit in natural juice (without  added sugar). Meat and other protein foods Skinless chicken or turkey. Ground chicken or turkey. Pork with fat trimmed off. Fish and seafood. Egg whites. Dried beans, peas, or lentils. Unsalted nuts, nut butters, and seeds. Unsalted canned beans. Lean cuts of beef with fat trimmed off. Low-sodium, lean deli meat. Dairy Low-fat (1%) or fat-free (skim) milk. Fat-free, low-fat, or reduced-fat cheeses. Nonfat, low-sodium ricotta or cottage cheese. Low-fat or nonfat yogurt. Low-fat, low-sodium cheese. Fats and oils Soft margarine without trans fats. Vegetable oil. Low-fat, reduced-fat, or light mayonnaise and salad dressings (reduced-sodium). Canola, safflower, olive, soybean, and sunflower oils. Avocado. Seasoning and other foods Herbs. Spices. Seasoning mixes without salt. Unsalted popcorn and pretzels. Fat-free sweets. What foods are not recommended? The items listed may not be a complete list. Talk with your dietitian about what dietary choices are best for you. Grains Baked goods made with fat, such as croissants, muffins, or some breads. Dry pasta or rice meal packs. Vegetables Creamed or fried vegetables. Vegetables in a cheese sauce. Regular canned vegetables (not low-sodium or reduced-sodium). Regular canned tomato sauce and paste (not low-sodium or reduced-sodium). Regular tomato and vegetable juice (not low-sodium or reduced-sodium). Pickles. Olives. Fruits Canned fruit in a light or heavy syrup. Fried fruit. Fruit in cream or butter sauce. Meat and other protein foods Fatty cuts of meat. Ribs. Fried meat. Bacon. Sausage. Bologna and other processed lunch meats. Salami. Fatback. Hotdogs. Bratwurst. Salted nuts and seeds. Canned beans with added salt. Canned or smoked fish. Whole eggs or egg yolks. Chicken or turkey with skin. Dairy Whole or 2% milk, cream, and half-and-half. Whole or full-fat cream cheese. Whole-fat or sweetened yogurt. Full-fat cheese. Nondairy creamers. Whipped toppings.  Processed cheese and cheese spreads. Fats and oils Butter. Stick margarine. Lard. Shortening. Ghee. Bacon fat. Tropical oils, such as coconut, palm kernel, or palm oil. Seasoning and other foods Salted popcorn and pretzels. Onion salt, garlic salt, seasoned salt, table salt, and sea salt. Worcestershire sauce. Tartar sauce. Barbecue sauce. Teriyaki sauce. Soy sauce, including reduced-sodium. Steak sauce. Canned and packaged gravies. Fish sauce. Oyster sauce. Cocktail sauce. Horseradish that you find on the shelf. Ketchup. Mustard. Meat flavorings and tenderizers. Bouillon cubes. Hot sauce and Tabasco sauce. Premade or packaged marinades. Premade or packaged taco seasonings. Relishes. Regular salad dressings. Where to find more information:  National Heart, Lung, and Blood Institute: www.nhlbi.nih.gov  American Heart Association: www.heart.org Summary  The DASH eating plan is a healthy eating plan that has been shown to reduce high blood pressure (hypertension). It may also reduce your risk for type 2 diabetes, heart disease, and stroke.  With the DASH eating plan, you should limit salt (sodium) intake to 2,300 mg a day. If you have hypertension, you may need to reduce your sodium intake to 1,500 mg a day.  When on the DASH eating plan, aim to eat more fresh fruits and vegetables, whole grains, lean proteins, low-fat dairy, and heart-healthy fats.  Work with your health care provider or diet and nutrition specialist (dietitian) to adjust your eating plan to your   individual calorie needs. This information is not intended to replace advice given to you by your health care provider. Make sure you discuss any questions you have with your health care provider. Document Revised: 10/06/2017 Document Reviewed: 10/17/2016 Elsevier Patient Education  2020 [employer] Inc.  

## 2020-09-14 NOTE — Progress Notes (Signed)
Subjective:    Patient ID: Austin Smith, male    DOB: Jan 25, 1966, 54 y.o.   MRN: [id number]   Chief Complaint: No chief complaint on file.    HPI:  1. Hypertension associated with type 2 diabetes mellitus ([name]) BP Readings from Last 3 Encounters:  03/13/20 (!) 156/72  05/15/19 (!) 146/79  10/02/18 (!) 148/78  Is not taking BP medication. Has been watching diet, increased water intake, avoids salt. Has stopped snacking. 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 Denies chest pain, SOB, or weakness.    2. Hyperlipidemia associated with type 2 diabetes mellitus ([name]) Lab Results  Component Value Date   CHOL 144 09/27/2019   HDL 30 (L) 09/27/2019   LDLCALC 74 09/27/2019   TRIG 239 (H) 09/27/2019   CHOLHDL 4.8 09/27/2019   Takes medication as prescribed. Cut back on fried foods to 1 x a week.   3. Type 2 diabetes mellitus without complication, without long-term current use of insulin ([name]) Lab Results  Component Value Date   HGBA1C 6.1 09/27/2019   Today's HA1C is 5.7. Does not take medication. Has improved diet and increased activity.   4. Morbid obesity ([name]) Wt Readings from Last 3 Encounters:  09/14/20 242 lb (109.8 kg)  03/13/20 247 lb (112 kg)  05/15/19 262 lb 9.6 oz (119.1 kg)   BMI Readings from Last 3 Encounters:  09/14/20 31.93 kg/m  03/13/20 32.59 kg/m  05/15/19 34.65 kg/m  Increased activity by walking and improved diet. Weight is down 5lbs form previous viist.     Outpatient Encounter Medications as of 09/14/2020  Medication Sig  . enalapril (VASOTEC) 2.5 MG tablet Take 1 tablet (2.5 mg total) by mouth daily.  . metFORMIN (GLUCOPHAGE) 500 MG tablet Take 1 tablet (500 mg total) by mouth 2 (two) times daily with a meal.  . simvastatin (ZOCOR) 40 MG tablet TAKE ONE (1) TABLET EACH DAY   No facility-administered encounter medications on file as of 09/14/2020.    History reviewed. No pertinent surgical history.  Family History  Problem Relation  Age of Onset  . Heart disease Mother        atrial fibrillation  . Cancer Paternal Aunt   . Cancer Maternal Grandmother        breast  . Early death Paternal Grandmother 7       myocardial in  . Alzheimer's disease Paternal Grandfather   . Kidney Stones Brother   . Thyroid disease Brother        produce too much calcium which cause kidney stones    New complaints: Would like to begin ED medication, does not have trouble with initial erection but cannot maintain it.   Social history: Lives at home with wife, fishes. Travels to [place].   Controlled substance contract: n/a     Review of Systems  Constitutional: Negative.   HENT: Negative.   Eyes: Negative.   Respiratory: Negative.   Cardiovascular: Negative.   Gastrointestinal: Negative.   Endocrine: Negative.   Genitourinary: Negative.   Musculoskeletal: Negative.   Skin: Negative.   Allergic/Immunologic: Negative.   Neurological: Negative.   Hematological: Negative.   Psychiatric/Behavioral: Negative.   All other systems reviewed and are negative.      Objective:   Physical Exam Vitals and nursing note reviewed.  Constitutional:      Appearance: Normal appearance.  HENT:     Head: Normocephalic and atraumatic.     Right Ear: Tympanic membrane, ear canal and  external ear normal.     Left Ear: Tympanic membrane, ear canal and external ear normal.     Nose: Nose normal.     Mouth/Throat:     Mouth: Mucous membranes are moist.     Pharynx: Oropharynx is clear.  Eyes:     Extraocular Movements: Extraocular movements intact.     Conjunctiva/sclera: Conjunctivae normal.     Pupils: Pupils are equal, round, and reactive to light.  Cardiovascular:     Rate and Rhythm: Normal rate and regular rhythm.     Pulses: Normal pulses.     Heart sounds: Normal heart sounds.  Pulmonary:     Effort: Pulmonary effort is normal.     Breath sounds: Normal breath sounds.  Abdominal:     General: Abdomen is flat. Bowel  sounds are normal.     Palpations: Abdomen is soft.  Musculoskeletal:        General: Normal range of motion.     Cervical back: Normal range of motion.  Skin:    General: Skin is warm and dry.     Capillary Refill: Capillary refill takes less than 2 seconds.  Neurological:     General: No focal deficit present.     Mental Status: He is alert and oriented to person, place, and time. Mental status is at baseline.  Psychiatric:        Mood and Affect: Mood normal.        Behavior: Behavior normal.        Thought Content: Thought content normal.        Judgment: Judgment normal.   BP (!) 141/63   Pulse (!) 51   Temp 97.9 F (36.6 C) (Temporal)   Resp 20   Ht 6\' 1"  (1.854 m)   Wt 242 lb (109.8 kg)   SpO2 96%   BMI 31.93 kg/m   hgba1c 5.7      Assessment & Plan:  Austin Smith comes in today with chief complaint of No chief complaint on file.   Diagnosis and orders addressed:  1. Hypertension associated with type 2 diabetes mellitus ([name]) Check blood pressure regularly and take medication as prescribed. Avoid foods that are high in salt and eat a heart healthy diet. Can continue to hold enalapril for now. Check blood pressure at ome a few times a week to make sure not going up  2. Hyperlipidemia associated with type 2 diabetes mellitus ([name]) Take medication as prescribed. . Avoid foods that are high in fat content.  continue zocor as prescribed  3. Type 2 diabetes mellitus without complication, without long-term current use of insulin ([name]) Check blood glucose levels regularly. Take medication as prescribed. Ok to continue to hold metformin  4. Morbid obesity ([name]) Exercise regularly. Walking and swimming are great low-impact exercises that help lower blood pressure, cholesterol levels, and blood glucose levels.    Labs pending Health Maintenance reviewed Diet and exercise encouraged  Follow up plan: Follow up in 6 months.    Mary-Margaret Raoul Pitch, FNP

## 2020-09-14 NOTE — Addendum Note (Signed)
Addended by: Bennie Pierini on: 09/14/2020 01:20 PM   Modules accepted: Orders

## 2020-09-15 LAB — PSA, TOTAL AND FREE
PSA, Free Pct: 32 %
PSA, Free: 0.16 ng/mL
Prostate Specific Ag, Serum: 0.5 ng/mL (ref 0.0–4.0)

## 2020-09-15 LAB — CMP14+EGFR
ALT: 12 IU/L (ref 0–44)
AST: 15 IU/L (ref 0–40)
Albumin/Globulin Ratio: 2 (ref 1.2–2.2)
Albumin: 4.8 g/dL (ref 3.8–4.9)
Alkaline Phosphatase: 80 IU/L (ref 44–121)
BUN/Creatinine Ratio: 13 (ref 9–20)
BUN: 9 mg/dL (ref 6–24)
Bilirubin Total: 0.4 mg/dL (ref 0.0–1.2)
CO2: 27 mmol/L (ref 20–29)
Calcium: 9.5 mg/dL (ref 8.7–10.2)
Chloride: 99 mmol/L (ref 96–106)
Creatinine, Ser: 0.72 mg/dL — ABNORMAL LOW (ref 0.76–1.27)
GFR calc Af Amer: 122 mL/min/{1.73_m2} (ref >59)
GFR calc non Af Amer: 106 mL/min/{1.73_m2} (ref >59)
Globulin, Total: 2.4 g/dL (ref 1.5–4.5)
Glucose: 85 mg/dL (ref 65–99)
Potassium: 4.4 mmol/L (ref 3.5–5.2)
Sodium: 141 mmol/L (ref 134–144)
Total Protein: 7.2 g/dL (ref 6.0–8.5)

## 2020-09-15 LAB — LIPID PANEL
Chol/HDL Ratio: 5.6 ratio — ABNORMAL HIGH (ref 0.0–5.0)
Cholesterol, Total: 197 mg/dL (ref 100–199)
HDL: 35 mg/dL — ABNORMAL LOW (ref >39)
LDL Chol Calc (NIH): 115 mg/dL — ABNORMAL HIGH (ref 0–99)
Triglycerides: 272 mg/dL — ABNORMAL HIGH (ref 0–149)
VLDL Cholesterol Cal: 47 mg/dL — ABNORMAL HIGH (ref 5–40)

## 2020-09-15 LAB — CBC WITH DIFFERENTIAL/PLATELET
Basophils Absolute: 0.1 10*3/uL (ref 0.0–0.2)
Basos: 1 %
EOS (ABSOLUTE): 0.3 10*3/uL (ref 0.0–0.4)
Eos: 3 %
Hematocrit: 48.7 % (ref 37.5–51.0)
Hemoglobin: 16.7 g/dL (ref 13.0–17.7)
Immature Grans (Abs): 0 10*3/uL (ref 0.0–0.1)
Immature Granulocytes: 0 %
Lymphocytes Absolute: 2.3 10*3/uL (ref 0.7–3.1)
Lymphs: 27 %
MCH: 30.8 pg (ref 26.6–33.0)
MCHC: 34.3 g/dL (ref 31.5–35.7)
MCV: 90 fL (ref 79–97)
Monocytes Absolute: 0.7 10*3/uL (ref 0.1–0.9)
Monocytes: 9 %
Neutrophils Absolute: 5.1 10*3/uL (ref 1.4–7.0)
Neutrophils: 60 %
Platelets: 234 10*3/uL (ref 150–450)
RBC: 5.42 x10E6/uL (ref 4.14–5.80)
RDW: 13.6 % (ref 11.6–15.4)
WBC: 8.5 10*3/uL (ref 3.4–10.8)

## 2020-09-15 LAB — MICROALBUMIN / CREATININE URINE RATIO
Creatinine, Urine: 146.1 mg/dL
Microalb/Creat Ratio: 9 mg/g creat (ref 0–29)
Microalbumin, Urine: 12.9 ug/mL

## 2020-09-28 ENCOUNTER — Ambulatory Visit: Payer: BC Managed Care – PPO | Admitting: Nurse Practitioner

## 2021-01-20 ENCOUNTER — Ambulatory Visit: Payer: BC Managed Care – PPO | Admitting: Family Medicine

## 2021-01-20 ENCOUNTER — Other Ambulatory Visit: Payer: Self-pay

## 2021-01-20 ENCOUNTER — Encounter: Payer: Self-pay | Admitting: Family Medicine

## 2021-01-20 ENCOUNTER — Ambulatory Visit (INDEPENDENT_AMBULATORY_CARE_PROVIDER_SITE_OTHER): Payer: BC Managed Care – PPO

## 2021-01-20 VITALS — BP 124/69 | HR 58 | Temp 98.1°F | Ht 73.0 in | Wt 246.4 lb

## 2021-01-20 DIAGNOSIS — S60031A Contusion of right middle finger without damage to nail, initial encounter: Secondary | ICD-10-CM

## 2021-01-20 DIAGNOSIS — S6991XA Unspecified injury of right wrist, hand and finger(s), initial encounter: Secondary | ICD-10-CM | POA: Diagnosis not present

## 2021-01-20 DIAGNOSIS — M7989 Other specified soft tissue disorders: Secondary | ICD-10-CM | POA: Diagnosis not present
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
## 2021-02-28 DIAGNOSIS — R059 Cough, unspecified: Secondary | ICD-10-CM | POA: Diagnosis not present

## 2021-02-28 DIAGNOSIS — J029 Acute pharyngitis, unspecified: Secondary | ICD-10-CM | POA: Diagnosis not present

## 2021-02-28 DIAGNOSIS — R0981 Nasal congestion: Secondary | ICD-10-CM | POA: Diagnosis not present

## 2021-03-15 ENCOUNTER — Ambulatory Visit: Payer: BC Managed Care – PPO | Admitting: Nurse Practitioner

## 2021-03-15 ENCOUNTER — Other Ambulatory Visit: Payer: Self-pay

## 2021-03-15 ENCOUNTER — Encounter: Payer: Self-pay | Admitting: Nurse Practitioner

## 2021-03-15 VITALS — BP 134/82 | HR 54 | Temp 98.2°F | Resp 20 | Ht 73.0 in | Wt 248.0 lb

## 2021-03-15 DIAGNOSIS — E1169 Type 2 diabetes mellitus with other specified complication: Secondary | ICD-10-CM

## 2021-03-15 DIAGNOSIS — E1159 Type 2 diabetes mellitus with other circulatory complications: Secondary | ICD-10-CM | POA: Diagnosis not present

## 2021-03-15 DIAGNOSIS — E119 Type 2 diabetes mellitus without complications: Secondary | ICD-10-CM

## 2021-03-15 DIAGNOSIS — I152 Hypertension secondary to endocrine disorders: Secondary | ICD-10-CM | POA: Diagnosis not present

## 2021-03-15 DIAGNOSIS — E785 Hyperlipidemia, unspecified: Secondary | ICD-10-CM

## 2021-03-15 LAB — CBC WITH DIFFERENTIAL/PLATELET
Basophils Absolute: 0.1 10*3/uL (ref 0.0–0.2)
Basos: 1 %
EOS (ABSOLUTE): 0.3 10*3/uL (ref 0.0–0.4)
Eos: 3 %
Hematocrit: 48 % (ref 37.5–51.0)
Hemoglobin: 16 g/dL (ref 13.0–17.7)
Immature Grans (Abs): 0 10*3/uL (ref 0.0–0.1)
Immature Granulocytes: 0 %
Lymphocytes Absolute: 1.9 10*3/uL (ref 0.7–3.1)
Lymphs: 21 %
MCH: 30 pg (ref 26.6–33.0)
MCHC: 33.3 g/dL (ref 31.5–35.7)
MCV: 90 fL (ref 79–97)
Monocytes Absolute: 0.9 10*3/uL (ref 0.1–0.9)
Monocytes: 9 %
Neutrophils Absolute: 6.2 10*3/uL (ref 1.4–7.0)
Neutrophils: 66 %
Platelets: 251 10*3/uL (ref 150–450)
RBC: 5.34 x10E6/uL (ref 4.14–5.80)
RDW: 13.8 % (ref 11.6–15.4)
WBC: 9.3 10*3/uL (ref 3.4–10.8)

## 2021-03-15 LAB — CMP14+EGFR
ALT: 20 IU/L (ref 0–44)
AST: 17 IU/L (ref 0–40)
Albumin/Globulin Ratio: 1.8 (ref 1.2–2.2)
Albumin: 4.5 g/dL (ref 3.8–4.9)
Alkaline Phosphatase: 73 IU/L (ref 44–121)
BUN/Creatinine Ratio: 16 (ref 9–20)
BUN: 12 mg/dL (ref 6–24)
Bilirubin Total: 0.3 mg/dL (ref 0.0–1.2)
CO2: 24 mmol/L (ref 20–29)
Calcium: 9.3 mg/dL (ref 8.7–10.2)
Chloride: 102 mmol/L (ref 96–106)
Creatinine, Ser: 0.73 mg/dL — ABNORMAL LOW (ref 0.76–1.27)
Globulin, Total: 2.5 g/dL (ref 1.5–4.5)
Glucose: 125 mg/dL — ABNORMAL HIGH (ref 65–99)
Potassium: 5.1 mmol/L (ref 3.5–5.2)
Sodium: 141 mmol/L (ref 134–144)
Total Protein: 7 g/dL (ref 6.0–8.5)
eGFR: 108 mL/min/{1.73_m2} (ref >59)

## 2021-03-15 LAB — LIPID PANEL
Chol/HDL Ratio: 6 ratio — ABNORMAL HIGH (ref 0.0–5.0)
Cholesterol, Total: 186 mg/dL (ref 100–199)
HDL: 31 mg/dL — ABNORMAL LOW (ref >39)
LDL Chol Calc (NIH): 92 mg/dL (ref 0–99)
Triglycerides: 376 mg/dL — ABNORMAL HIGH (ref 0–149)
VLDL Cholesterol Cal: 63 mg/dL — ABNORMAL HIGH (ref 5–40)

## 2021-03-15 LAB — BAYER DCA HB A1C WAIVED: HB A1C (BAYER DCA - WAIVED): 6 % (ref <7.0)

## 2021-03-15 MED ORDER — SIMVASTATIN 40 MG PO TABS: ORAL_TABLET | ORAL | 1 refills | Status: DC

## 2021-03-15 NOTE — Patient Instructions (Signed)
Exercising to Stay Healthy To become healthy and stay healthy, it is recommended that you do moderate-intensity and vigorous-intensity exercise. You can tell that you are exercising at a moderate intensity if your heart starts beating faster and you start breathing faster but can still hold a conversation. You can tell that you are exercising at a vigorous intensity if you are breathing much harder and faster and cannot hold a conversation while exercising. Exercising regularly is important. It has many health benefits, such as:  Improving overall fitness, flexibility, and endurance.  Increasing bone density.  Helping with weight control.  Decreasing body fat.  Increasing muscle strength.  Reducing stress and tension.  Improving overall health. How often should I exercise? Choose an activity that you enjoy, and set realistic goals. Your health care provider can help you make an activity plan that works for you. Exercise regularly as told by your health care provider. This may include:  Doing strength training two times a week, such as: ? Lifting weights. ? Using resistance bands. ? Push-ups. ? Sit-ups. ? Yoga.  Doing a certain intensity of exercise for a given amount of time. Choose from these options: ? A total of 150 minutes of moderate-intensity exercise every week. ? A total of 75 minutes of vigorous-intensity exercise every week. ? A mix of moderate-intensity and vigorous-intensity exercise every week. Children, pregnant women, people who have not exercised regularly, people who are overweight, and older adults may need to talk with a health care provider about what activities are safe to do. If you have a medical condition, be sure to talk with your health care provider before you start a new exercise program. What are some exercise ideas? Moderate-intensity exercise ideas include:  Walking 1 mile (1.6 km) in about 15  minutes.  Biking.  Hiking.  Golfing.  Dancing.  Water aerobics. Vigorous-intensity exercise ideas include:  Walking 4.5 miles (7.2 km) or more in about 1 hour.  Jogging or running 5 miles (8 km) in about 1 hour.  Biking 10 miles (16.1 km) or more in about 1 hour.  Lap swimming.  Roller-skating or in-line skating.  Cross-country skiing.  Vigorous competitive sports, such as football, basketball, and soccer.  Jumping rope.  Aerobic dancing.   What are some everyday activities that can help me to get exercise?  Yard work, such as: ? Pushing a lawn mower. ? Raking and bagging leaves.  Washing your car.  Pushing a stroller.  Shoveling snow.  Gardening.  Washing windows or floors. How can I be more active in my day-to-day activities?  Use stairs instead of an elevator.  Take a walk during your lunch break.  If you drive, park your car farther away from your work or school.  If you take public transportation, get off one stop early and walk the rest of the way.  Stand up or walk around during all of your indoor phone calls.  Get up, stretch, and walk around every 30 minutes throughout the day.  Enjoy exercise with a friend. Support to continue exercising will help you keep a regular routine of activity. What guidelines can I follow while exercising?  Before you start a new exercise program, talk with your health care provider.  Do not exercise so much that you hurt yourself, feel dizzy, or get very short of breath.  Wear comfortable clothes and wear shoes with good support.  Drink plenty of water while you exercise to prevent dehydration or heat stroke.  Work out until   your breathing and your heartbeat get faster. Where to find more information  [place] Department of Health and Human Services: www.hhs.gov  Centers for Disease Control and Prevention (CDC): www.cdc.gov Summary  Exercising regularly is important. It will improve your overall fitness,  flexibility, and endurance.  Regular exercise also will improve your overall health. It can help you control your weight, reduce stress, and improve your bone density.  Do not exercise so much that you hurt yourself, feel dizzy, or get very short of breath.  Before you start a new exercise program, talk with your health care provider. This information is not intended to replace advice given to you by your health care provider. Make sure you discuss any questions you have with your health care provider. Document Revised: 10/06/2017 Document Reviewed: 09/14/2017 Elsevier Patient Education  2021 Elsevier Inc.  

## 2021-03-15 NOTE — Progress Notes (Signed)
Subjective:    Patient ID: Austin Smith, male    DOB: 04-09-1966, 55 y.o.   MRN: [id number]   Chief Complaint: medical management of chronic issues     HPI:  1. Hypertension associated with type 2 diabetes mellitus ([place]) No c/o chest pain, sob or headache. Does no check blood pressure at home. BP Readings from Last 3 Encounters:  01/20/21 124/69  09/14/20 (!) 141/63  03/13/20 (!) 156/72     2. Hyperlipidemia associated with type 2 diabetes mellitus ([place]) Does not watch diet and does no dedicated exercise. Is currently on zocor daily. Lab Results  Component Value Date   CHOL 197 09/14/2020   HDL 35 (L) 09/14/2020   LDLCALC 115 (H) 09/14/2020   TRIG 272 (H) 09/14/2020   CHOLHDL 5.6 (H) 09/14/2020     3. Type 2 diabetes mellitus without complication, without long-term current use of insulin ([name]) Patient doe snot check his blood sugars at home. He does try to avoid sweets.  Lab Results  Component Value Date   HGBA1C 5.7 09/14/2020     4. Morbid obesity ([place]) No recent weight changes. Wt Readings from Last 3 Encounters:  03/15/21 248 lb (112.5 kg)  01/20/21 246 lb 6.4 oz (111.8 kg)  09/14/20 242 lb (109.8 kg)   BMI Readings from Last 3 Encounters:  03/15/21 32.72 kg/m  01/20/21 32.51 kg/m  09/14/20 31.93 kg/m       Outpatient Encounter Medications as of 03/15/2021  Medication Sig  . sildenafil (REVATIO) 20 MG tablet Take 1 tablet (20 mg total) by mouth 3 (three) times daily.  . simvastatin (ZOCOR) 40 MG tablet TAKE ONE (1) TABLET EACH DAY   No facility-administered encounter medications on file as of 03/15/2021.    No past surgical history on file.  Family History  Problem Relation Age of Onset  . Heart disease Mother        atrial fibrillation  . Cancer Paternal Aunt   . Cancer Maternal Grandmother        breast  . Early death Paternal Grandmother 33       myocardial in  . Alzheimer's disease Paternal Grandfather   . Kidney Stones Brother   .  Thyroid disease Brother        produce too much calcium which cause kidney stones    New complaints: None today  Social history: liuves his wife  Controlled substance contract: n/a    Review of Systems  Constitutional: Negative for diaphoresis.  Eyes: Negative for pain.  Respiratory: Negative for shortness of breath.   Cardiovascular: Negative for chest pain, palpitations and leg swelling.  Gastrointestinal: Negative for abdominal pain.  Endocrine: Negative for polydipsia.  Skin: Negative for rash.  Neurological: Negative for dizziness, weakness and headaches.  Hematological: Does not bruise/bleed easily.  All other systems reviewed and are negative.      Objective:   Physical Exam Vitals and nursing note reviewed.  Constitutional:      Appearance: Normal appearance. He is well-developed.  HENT:     Head: Normocephalic.     Nose: Nose normal.  Eyes:     Pupils: Pupils are equal, round, and reactive to light.  Neck:     Thyroid: No thyroid mass or thyromegaly.     Vascular: No carotid bruit or JVD.     Trachea: Phonation normal.  Cardiovascular:     Rate and Rhythm: Normal rate and regular rhythm.  Pulmonary:     Effort: Pulmonary effort is  normal. No respiratory distress.     Breath sounds: Normal breath sounds.  Abdominal:     General: Bowel sounds are normal.     Palpations: Abdomen is soft.     Tenderness: There is no abdominal tenderness.  Musculoskeletal:        General: Normal range of motion.     Cervical back: Normal range of motion and neck supple.  Lymphadenopathy:     Cervical: No cervical adenopathy.  Skin:    General: Skin is warm and dry.  Neurological:     Mental Status: He is alert and oriented to person, place, and time.  Psychiatric:        Behavior: Behavior normal.        Thought Content: Thought content normal.        Judgment: Judgment normal.    BP (!) 146/66   Pulse (!) 54   Temp 98.2 F (36.8 C) (Temporal)   Resp 20   Ht  _0  (1.854 m)   Wt 248 lb (112.5 kg)   SpO2 98%   BMI 32.72 kg/m   HGBA1c 6.0%      Assessment & Plan:  Austin Smith comes in today with chief complaint of Medical Management of Chronic Issues   Diagnosis and orders addressed:  1. Hypertension associated with type 2 diabetes mellitus ([name]) Low sodium diet - CBC with Differential/Platelet - CMP14+EGFR  2. Hyperlipidemia associated with type 2 diabetes mellitus ([name]) Low fat diet - Lipid panel  3. Type 2 diabetes mellitus without complication, without long-term current use of insulin ([name]) Continue to watch diet - Bayer DCA Hb A1c Waived  4. Morbid obesity ([place]) Discussed diet and exercise for person with BMI >25 Will recheck weight in 3-6 months    Labs pending Health Maintenance reviewed Diet and exercise encouraged  Follow up plan: 3 months   Mary-Margaret Hassell Done, FNP

## 2021-06-15 ENCOUNTER — Ambulatory Visit: Payer: Self-pay | Admitting: Nurse Practitioner

## 2021-06-21 ENCOUNTER — Other Ambulatory Visit: Payer: Self-pay

## 2021-06-21 ENCOUNTER — Encounter: Payer: Self-pay | Admitting: Nurse Practitioner

## 2021-06-21 ENCOUNTER — Ambulatory Visit: Payer: BC Managed Care – PPO | Admitting: Nurse Practitioner

## 2021-06-21 VITALS — BP 151/81 | HR 50 | Temp 98.2°F | Resp 20 | Ht 73.0 in | Wt 250.0 lb

## 2021-06-21 DIAGNOSIS — E119 Type 2 diabetes mellitus without complications: Secondary | ICD-10-CM | POA: Diagnosis not present

## 2021-06-21 DIAGNOSIS — E785 Hyperlipidemia, unspecified: Secondary | ICD-10-CM

## 2021-06-21 DIAGNOSIS — E1169 Type 2 diabetes mellitus with other specified complication: Secondary | ICD-10-CM

## 2021-06-21 DIAGNOSIS — I152 Hypertension secondary to endocrine disorders: Secondary | ICD-10-CM | POA: Diagnosis not present

## 2021-06-21 DIAGNOSIS — E1159 Type 2 diabetes mellitus with other circulatory complications: Secondary | ICD-10-CM

## 2021-06-21 LAB — CBC WITH DIFFERENTIAL/PLATELET
Basophils Absolute: 0.1 10*3/uL (ref 0.0–0.2)
Basos: 1 %
EOS (ABSOLUTE): 0.3 10*3/uL (ref 0.0–0.4)
Eos: 4 %
Hematocrit: 45.2 % (ref 37.5–51.0)
Hemoglobin: 15.2 g/dL (ref 13.0–17.7)
Immature Grans (Abs): 0 10*3/uL (ref 0.0–0.1)
Immature Granulocytes: 1 %
Lymphocytes Absolute: 2 10*3/uL (ref 0.7–3.1)
Lymphs: 26 %
MCH: 30 pg (ref 26.6–33.0)
MCHC: 33.6 g/dL (ref 31.5–35.7)
MCV: 89 fL (ref 79–97)
Monocytes Absolute: 0.8 10*3/uL (ref 0.1–0.9)
Monocytes: 10 %
Neutrophils Absolute: 4.5 10*3/uL (ref 1.4–7.0)
Neutrophils: 58 %
Platelets: 247 10*3/uL (ref 150–450)
RBC: 5.06 x10E6/uL (ref 4.14–5.80)
RDW: 13.5 % (ref 11.6–15.4)
WBC: 7.7 10*3/uL (ref 3.4–10.8)

## 2021-06-21 LAB — CMP14+EGFR
ALT: 14 IU/L (ref 0–44)
AST: 19 IU/L (ref 0–40)
Albumin/Globulin Ratio: 2 (ref 1.2–2.2)
Albumin: 4.7 g/dL (ref 3.8–4.9)
Alkaline Phosphatase: 70 IU/L (ref 44–121)
BUN/Creatinine Ratio: 17 (ref 9–20)
BUN: 12 mg/dL (ref 6–24)
Bilirubin Total: 0.2 mg/dL (ref 0.0–1.2)
CO2: 24 mmol/L (ref 20–29)
Calcium: 9.2 mg/dL (ref 8.7–10.2)
Chloride: 102 mmol/L (ref 96–106)
Creatinine, Ser: 0.69 mg/dL — ABNORMAL LOW (ref 0.76–1.27)
Globulin, Total: 2.3 g/dL (ref 1.5–4.5)
Glucose: 113 mg/dL — ABNORMAL HIGH (ref 65–99)
Potassium: 4.5 mmol/L (ref 3.5–5.2)
Sodium: 141 mmol/L (ref 134–144)
Total Protein: 7 g/dL (ref 6.0–8.5)
eGFR: 110 mL/min/{1.73_m2} (ref >59)

## 2021-06-21 LAB — LIPID PANEL
Chol/HDL Ratio: 4.9 ratio (ref 0.0–5.0)
Cholesterol, Total: 173 mg/dL (ref 100–199)
HDL: 35 mg/dL — ABNORMAL LOW (ref >39)
LDL Chol Calc (NIH): 98 mg/dL (ref 0–99)
Triglycerides: 237 mg/dL — ABNORMAL HIGH (ref 0–149)
VLDL Cholesterol Cal: 40 mg/dL (ref 5–40)

## 2021-06-21 LAB — BAYER DCA HB A1C WAIVED: HB A1C (BAYER DCA - WAIVED): 6.2 % (ref <7.0)

## 2021-06-21 MED ORDER — HYDROCHLOROTHIAZIDE 12.5 MG PO CAPS: 12.5000 mg | ORAL_CAPSULE | Freq: Every day | ORAL | 1 refills | Status: DC

## 2021-06-21 NOTE — Patient Instructions (Signed)

## 2021-06-21 NOTE — Progress Notes (Signed)
Subjective:    Patient ID: Austin Smith, male    DOB: 05-23-1966, 55 y.o.   MRN: [id number]   Chief Complaint: medical management of chronic issues     HPI:  1. Hypertension associated with type 2 diabetes mellitus ([name]) No c/o chest pain, sob or headache. Does not check blood pressure at home. BP Readings from Last 3 Encounters:  06/21/21 (!) 151/81  03/15/21 134/82  01/20/21 124/69      2. Hyperlipidemia associated with type 2 diabetes mellitus ([name]) Does not watch diet very closely. Does no dedicated exercise. Lab Results  Component Value Date   CHOL 186 03/15/2021   HDL 31 (L) 03/15/2021   LDLCALC 92 03/15/2021   TRIG 376 (H) 03/15/2021   CHOLHDL 6.0 (H) 03/15/2021     3. Type 2 diabetes mellitus without complication, without long-term current use of insulin ([name]) He doe snot check his blood sugars att home. He is on no meds right now andis just doing diet control. Lab Results  Component Value Date   HGBA1C 6.0 03/15/2021     4. Morbid obesity ([name]) No recent weight changes Wt Readings from Last 3 Encounters:  06/21/21 250 lb (113.4 kg)  03/15/21 248 lb (112.5 kg)  01/20/21 246 lb 6.4 oz (111.8 kg)   BMI Readings from Last 3 Encounters:  06/21/21 32.98 kg/m  03/15/21 32.72 kg/m  01/20/21 32.51 kg/m       Outpatient Encounter Medications as of 06/21/2021  Medication Sig   sildenafil (REVATIO) 20 MG tablet Take 1 tablet (20 mg total) by mouth 3 (three) times daily.   simvastatin (ZOCOR) 40 MG tablet TAKE ONE (1) TABLET EACH DAY   No facility-administered encounter medications on file as of 06/21/2021.    No past surgical history on file.  Family History  Problem Relation Age of Onset   Heart disease Mother        atrial fibrillation   Cancer Paternal Aunt    Cancer Maternal Grandmother        breast   Early death Paternal Grandmother 33       myocardial in   Alzheimer's disease Paternal Grandfather    Kidney Stones Brother    Thyroid  disease Brother        produce too much calcium which cause kidney stones    New complaints: None today  Social history: Lives with Austin Smith  Controlled substance contract: n/a     Review of Systems  Constitutional:  Negative for diaphoresis.  Eyes:  Negative for pain.  Respiratory:  Negative for shortness of breath.   Cardiovascular:  Negative for chest pain, palpitations and leg swelling.  Gastrointestinal:  Negative for abdominal pain.  Endocrine: Negative for polydipsia.  Skin:  Negative for rash.  Neurological:  Negative for dizziness, weakness and headaches.  Hematological:  Does not bruise/bleed easily.  All other systems reviewed and are negative.     Objective:   Physical Exam Vitals and nursing note reviewed.  Constitutional:      Appearance: Normal appearance. He is well-developed.  HENT:     Head: Normocephalic.     Nose: Nose normal.  Eyes:     Pupils: Pupils are equal, round, and reactive to light.  Neck:     Thyroid: No thyroid mass or thyromegaly.     Vascular: No carotid bruit or JVD.     Trachea: Phonation normal.  Cardiovascular:     Rate and Rhythm: Normal rate and regular rhythm.  Pulmonary:     Effort: Pulmonary effort is normal. No respiratory distress.     Breath sounds: Normal breath sounds.  Abdominal:     General: Bowel sounds are normal.     Palpations: Abdomen is soft.     Tenderness: There is no abdominal tenderness.  Musculoskeletal:        General: Normal range of motion.     Cervical back: Normal range of motion and neck supple.  Lymphadenopathy:     Cervical: No cervical adenopathy.  Skin:    General: Skin is warm and dry.  Neurological:     Mental Status: He is alert and oriented to person, place, and time.  Psychiatric:        Behavior: Behavior normal.        Thought Content: Thought content normal.        Judgment: Judgment normal.    BP (!) 151/81   Pulse (!) 50   Temp 98.2 F (36.8 C) (Temporal)   Resp 20    Ht 6' 1"  (1.854 m)   Wt 250 lb (113.4 kg)   SpO2 97%   BMI 32.98 kg/m         Assessment & Plan:  Austin Smith comes in today with chief complaint of Medical Management of Chronic Issues   Diagnosis and orders addressed:  1. Hypertension associated with type 2 diabetes mellitus ([name]) Added HCTZ to meds today Low sodium diet - hydrochlorothiazide (MICROZIDE) 12.5 MG capsule; Take 1 capsule (12.5 mg total) by mouth daily.  Dispense: 90 capsule; Refill: 1 - CBC with Differential/Platelet - CMP14+EGFR  2. Hyperlipidemia associated with type 2 diabetes mellitus ([name]) Low fat diet - Lipid panel  3. Type 2 diabetes mellitus without complication, without long-term current use of insulin ([name]) Continue to watch carbs in diet - Bayer DCA Hb A1c Waived  4. Morbid obesity ([name]) Discussed diet and exercise for person with BMI >25 Will recheck weight in 3-6 months    Labs pending Health Maintenance reviewed Diet and exercise encouraged  Follow up plan: 6 months   Mary-Margaret Hassell Done, FNP

## 2021-06-23 ENCOUNTER — Encounter: Payer: Self-pay | Admitting: Nurse Practitioner

## 2021-06-23 ENCOUNTER — Ambulatory Visit: Payer: BC Managed Care – PPO | Admitting: Nurse Practitioner

## 2021-06-23 DIAGNOSIS — J029 Acute pharyngitis, unspecified: Secondary | ICD-10-CM | POA: Insufficient documentation

## 2021-06-23 LAB — RAPID STREP SCREEN (MED CTR MEBANE ONLY): Strep Gp A Ag, IA W/Reflex: NEGATIVE

## 2021-06-23 LAB — CULTURE, GROUP A STREP

## 2021-06-23 MED ORDER — AMOXICILLIN-POT CLAVULANATE 875-125 MG PO TABS: 1.0000 | ORAL_TABLET | Freq: Two times a day (BID) | ORAL | 0 refills | Status: DC

## 2021-06-23 NOTE — Progress Notes (Signed)
   Virtual Visit  Note Due to COVID-19 pandemic this visit was conducted virtually. This visit type was conducted due to national recommendations for restrictions regarding the COVID-19 Pandemic (e.g. social distancing, sheltering in place) in an effort to limit this patient's exposure and mitigate transmission in our community. All issues noted in this document were discussed and addressed.  A physical exam was not performed with this format.  I connected with Austin Smith on 06/23/21 at 10.30 am  by telephone and verified that I am speaking with the correct person using two identifiers. Austin Smith is currently located at home during visit. The provider, Daryll Drown, NP is located in their office at time of visit.  I discussed the limitations, risks, security and privacy concerns of performing an evaluation and management service by telephone and the availability of in person appointments. I also discussed with the patient that there may be a patient responsible charge related to this service. The patient expressed understanding and agreed to proceed.   History and Present Illness:  Sore Throat  This is a new problem. The current episode started yesterday. The problem has been gradually worsening. There has been no fever. The pain is moderate. Associated symptoms include swollen glands and trouble swallowing. Pertinent negatives include no congestion, coughing, headaches or vomiting. He has had no exposure to strep. He has tried nothing for the symptoms.     Review of Systems  Constitutional:  Negative for chills and fever.  HENT:  Positive for trouble swallowing. Negative for congestion.   Respiratory:  Negative for cough.   Gastrointestinal:  Negative for vomiting.  Skin:  Negative for rash.  Neurological:  Negative for headaches.  All other systems reviewed and are negative.   Observations/Objective: Televisit patient not in distress.  Assessment and Plan:  Symptoms of sore  throat [pharyngitis] not well controlled in the past 24 to 36 hours.  Symptoms of pain and difficulty swallowing.  Red and white patches inside throat.  No fever or chills associated with symptoms.  No cough, nausea or vomiting.  Encourage patient to complete strep throat swab results pending.  Tylenol/ibuprofen for pain, Chloraseptic spray, warm salt water gargle.  Augmentin 875-125 mg tablet by mouth for 10 days.  Follow-up with worsening or unresolved symptoms.  Follow Up Instructions: Follow up with worsening unresolved symptoms    I discussed the assessment and treatment plan with the patient. The patient was provided an opportunity to ask questions and all were answered. The patient agreed with the plan and demonstrated an understanding of the instructions.   The patient was advised to call back or seek an in-person evaluation if the symptoms worsen or if the condition fails to improve as anticipated.  The above assessment and management plan was discussed with the patient. The patient verbalized understanding of and has agreed to the management plan. Patient is aware to call the clinic if symptoms persist or worsen. Patient is aware when to return to the clinic for a follow-up visit. Patient educated on when it is appropriate to go to the emergency department.   Time call ended:  10:39 am   I provided 9 minutes of  non face-to-face time during this encounter.    Daryll Drown, NP

## 2021-06-23 NOTE — Assessment & Plan Note (Signed)
  Symptoms of sore throat [pharyngitis] not well controlled in the past 24 to 36 hours.  Symptoms of pain and difficulty swallowing.  Red and white patches inside throat.  No fever or chills associated with symptoms.  No cough, nausea or vomiting.  Encourage patient to complete strep throat swab results pending.  Tylenol/ibuprofen for pain, Chloraseptic spray, warm salt water gargle.  Augmentin 875-125 mg tablet by mouth for 10 days.  Follow-up with worsening or unresolved symptoms.

## 2021-09-24 ENCOUNTER — Other Ambulatory Visit: Payer: Self-pay | Admitting: Nurse Practitioner

## 2021-09-24 DIAGNOSIS — E1159 Type 2 diabetes mellitus with other circulatory complications: Secondary | ICD-10-CM

## 2021-09-24 DIAGNOSIS — I152 Hypertension secondary to endocrine disorders: Secondary | ICD-10-CM

## 2021-11-10 ENCOUNTER — Encounter: Payer: Self-pay | Admitting: *Deleted

## 2021-11-21 IMAGING — DX DG FINGER MIDDLE 2+V*R*
3 series · 3 of 3 positions shown · non-contrast
Comparison: None.

CLINICAL DATA: Middle finger injury

EXAM:
RIGHT MIDDLE FINGER 2+V

[finger ap]
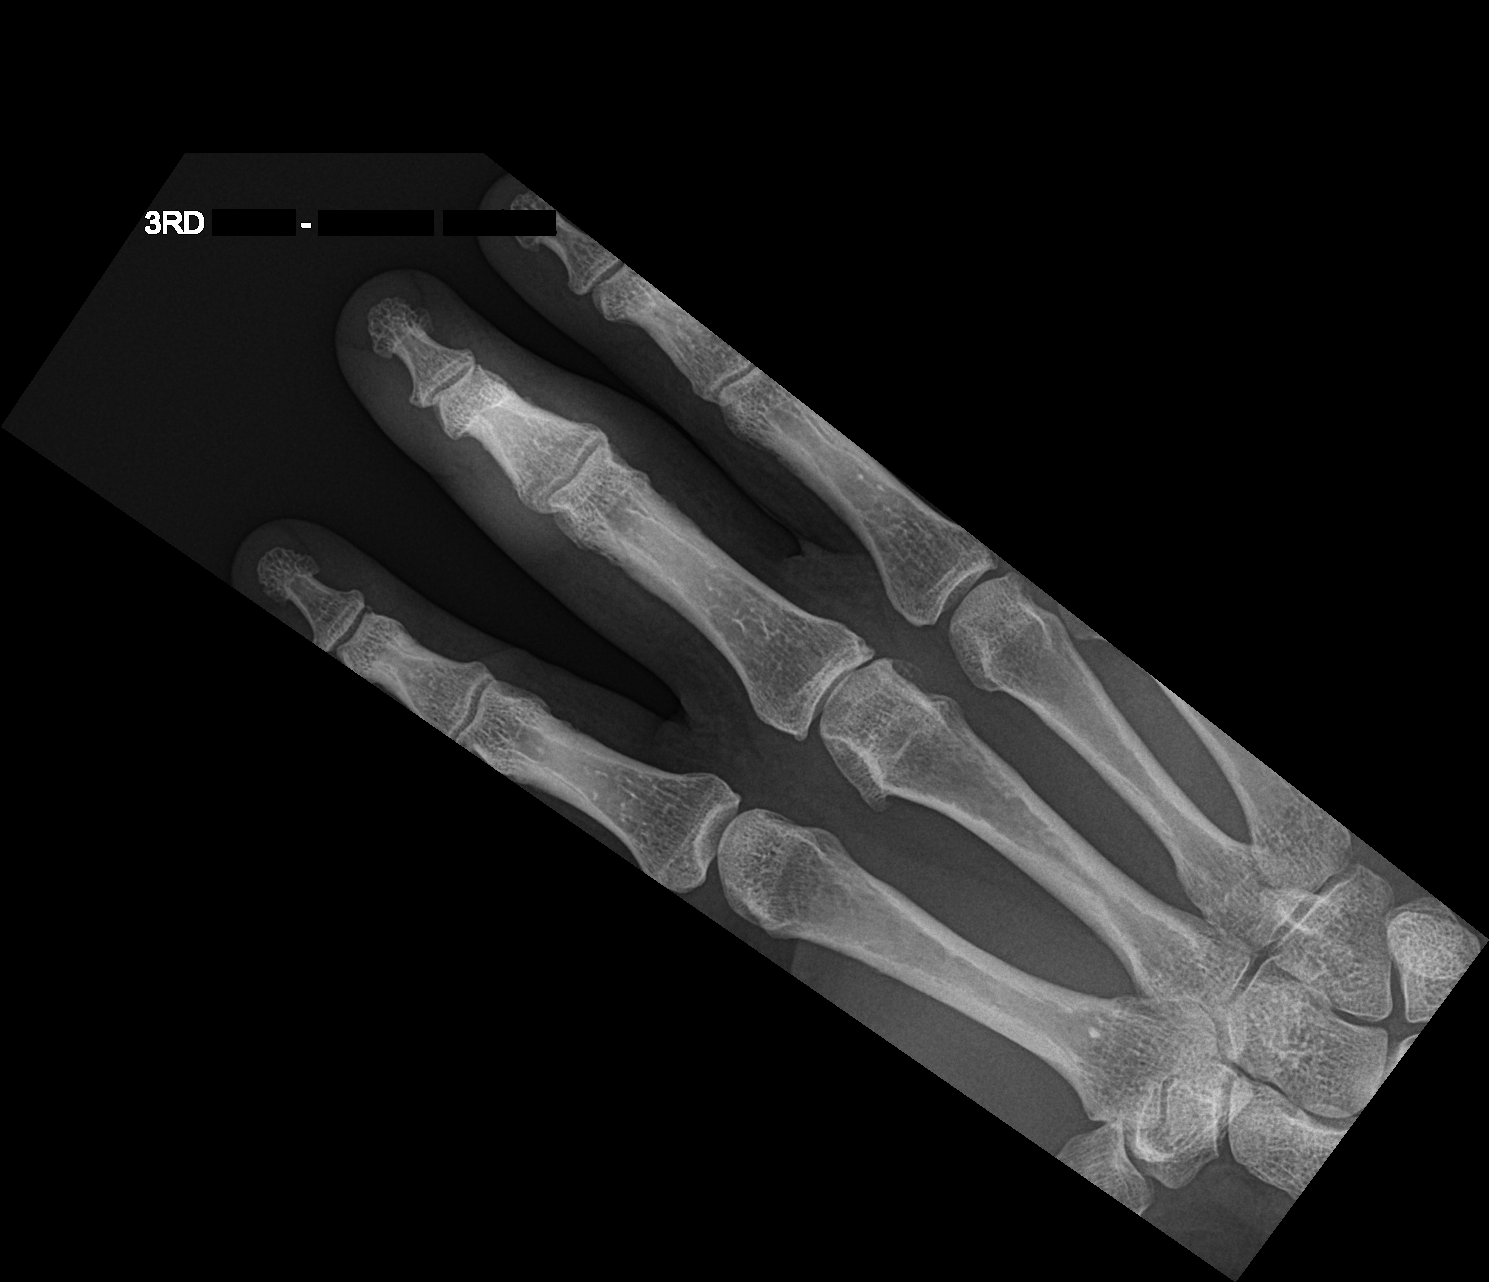

[finger obl]
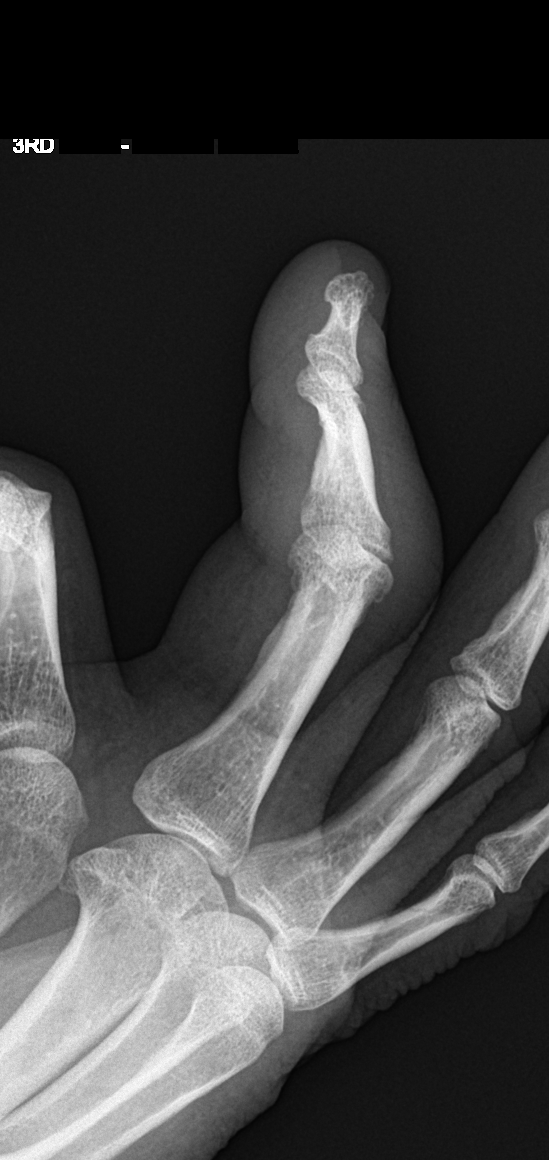

[finger lat]
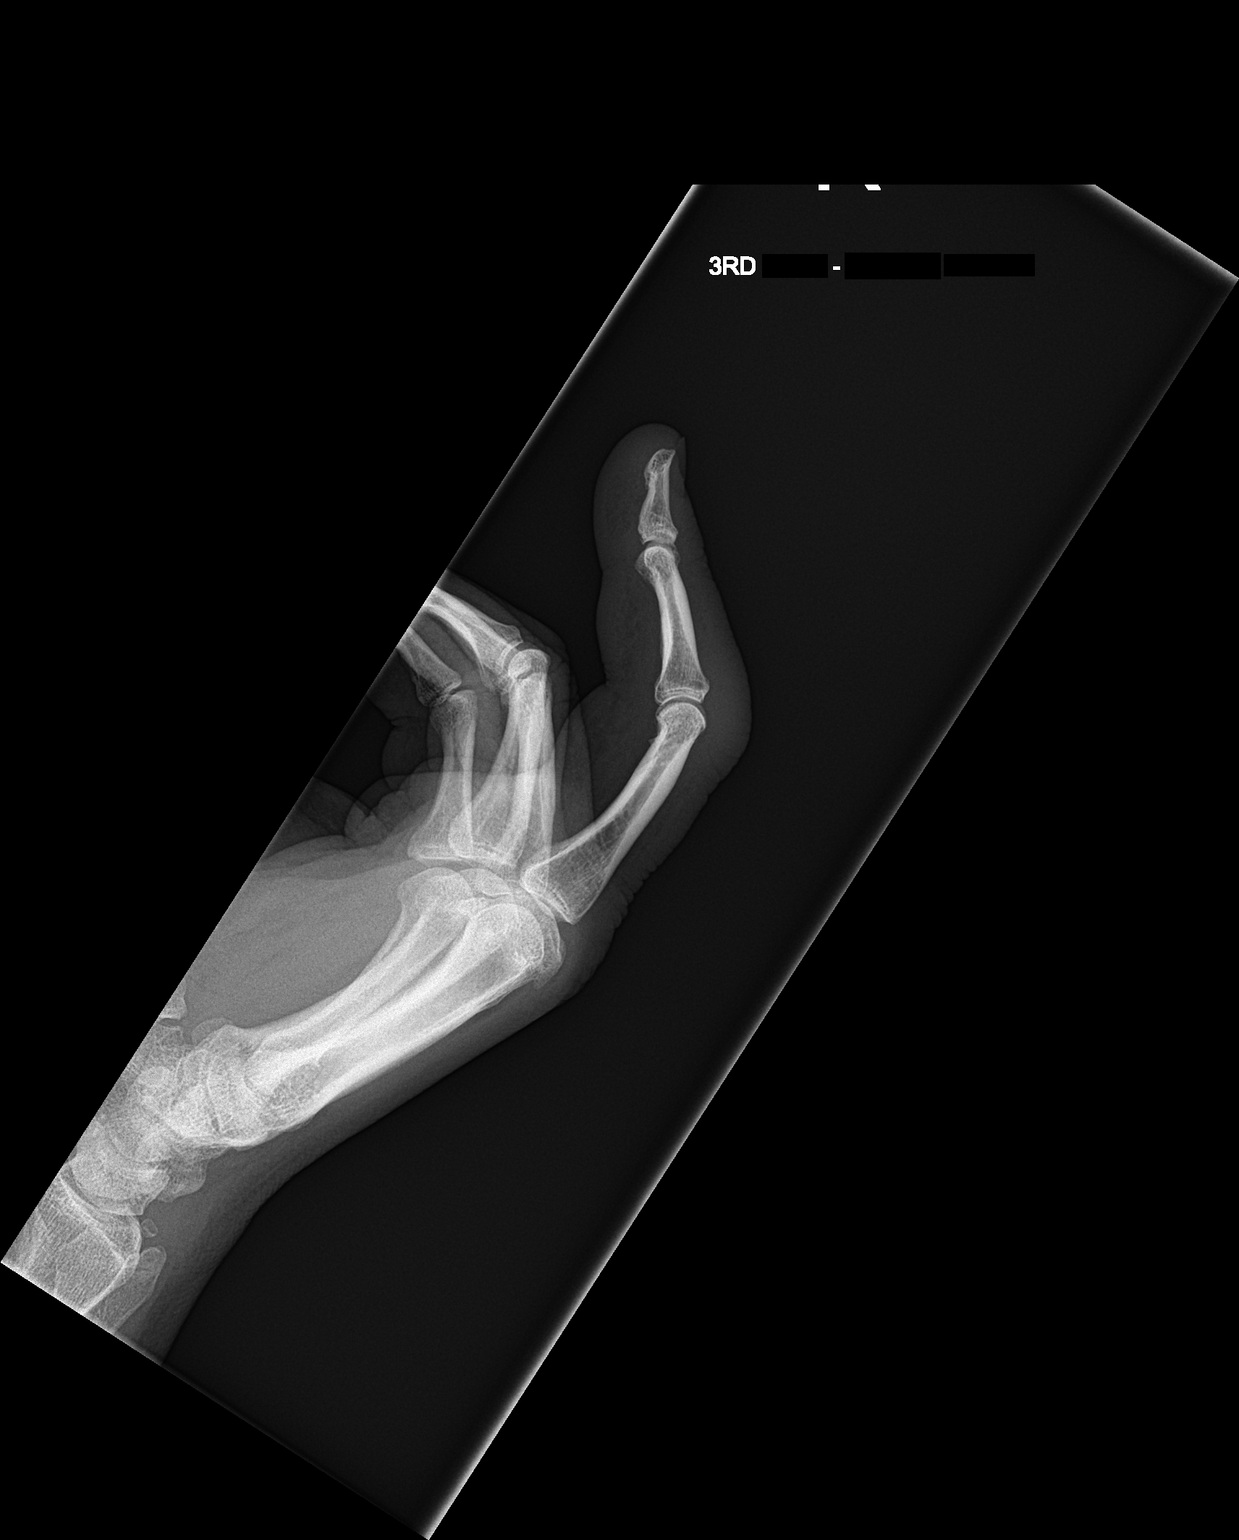

[3 of 3 positions shown; findings below may reference images not displayed]

FINDINGS: No acute displaced fracture or malalignment. Prominent third digit
soft tissue swelling. No radiopaque foreign body.
IMPRESSION: Soft tissue swelling. No acute osseous abnormality.

## 2021-12-25 ENCOUNTER — Other Ambulatory Visit: Payer: Self-pay | Admitting: Nurse Practitioner

## 2021-12-25 DIAGNOSIS — E1159 Type 2 diabetes mellitus with other circulatory complications: Secondary | ICD-10-CM

## 2021-12-28 ENCOUNTER — Ambulatory Visit: Payer: BC Managed Care – PPO | Admitting: Nurse Practitioner

## 2021-12-29 ENCOUNTER — Ambulatory Visit: Payer: BC Managed Care – PPO | Admitting: Nurse Practitioner

## 2022-01-20 ENCOUNTER — Other Ambulatory Visit: Payer: Self-pay | Admitting: Nurse Practitioner

## 2022-01-26 ENCOUNTER — Ambulatory Visit: Payer: BC Managed Care – PPO | Admitting: Nurse Practitioner

## 2022-02-07 ENCOUNTER — Ambulatory Visit: Payer: Managed Care, Other (non HMO) | Admitting: Nurse Practitioner

## 2022-02-07 ENCOUNTER — Encounter: Payer: Self-pay | Admitting: Nurse Practitioner

## 2022-02-07 VITALS — BP 162/82 | HR 72 | Temp 98.0°F | Resp 20 | Ht 73.0 in | Wt 257.0 lb

## 2022-02-07 DIAGNOSIS — I152 Hypertension secondary to endocrine disorders: Secondary | ICD-10-CM

## 2022-02-07 DIAGNOSIS — Z1211 Encounter for screening for malignant neoplasm of colon: Secondary | ICD-10-CM

## 2022-02-07 DIAGNOSIS — E1169 Type 2 diabetes mellitus with other specified complication: Secondary | ICD-10-CM

## 2022-02-07 DIAGNOSIS — E785 Hyperlipidemia, unspecified: Secondary | ICD-10-CM

## 2022-02-07 DIAGNOSIS — H6123 Impacted cerumen, bilateral: Secondary | ICD-10-CM

## 2022-02-07 DIAGNOSIS — E119 Type 2 diabetes mellitus without complications: Secondary | ICD-10-CM

## 2022-02-07 DIAGNOSIS — Z1212 Encounter for screening for malignant neoplasm of rectum: Secondary | ICD-10-CM

## 2022-02-07 DIAGNOSIS — E1159 Type 2 diabetes mellitus with other circulatory complications: Secondary | ICD-10-CM | POA: Diagnosis not present

## 2022-02-07 LAB — BAYER DCA HB A1C WAIVED: HB A1C (BAYER DCA - WAIVED): 6.9 % — ABNORMAL HIGH (ref 4.8–5.6)

## 2022-02-07 MED ORDER — HYDROCHLOROTHIAZIDE 25 MG PO TABS: 25.0000 mg | ORAL_TABLET | Freq: Every day | ORAL | 3 refills | Status: DC

## 2022-02-07 MED ORDER — SIMVASTATIN 40 MG PO TABS: 40.0000 mg | ORAL_TABLET | Freq: Every day | ORAL | 1 refills | Status: DC

## 2022-02-07 NOTE — Addendum Note (Signed)
Addended by: Bennie Pierini on: 02/07/2022 09:24 AM ? ? Modules accepted: Orders ? ?

## 2022-02-07 NOTE — Patient Instructions (Signed)
Earwax Buildup, Adult ?The ears produce a substance called earwax that helps keep bacteria out of the ear and protects the skin in the ear canal. Occasionally, earwax can build up in the ear and cause discomfort or hearing loss. ?What are the causes? ?This condition is caused by a buildup of earwax. Ear canals are self-cleaning. Ear wax is made in the outer part of the ear canal and generally falls out in small amounts over time. ?When the self-cleaning mechanism is not working, earwax builds up and can cause decreased hearing and discomfort. Attempting to clean ears with cotton swabs can push the earwax deep into the ear canal and cause decreased hearing and pain. ?What increases the risk? ?This condition is more likely to develop in people who: ?Clean their ears often with cotton swabs. ?Pick at their ears. ?Use earplugs or in-ear headphones often, or wear hearing aids. ?The following factors may also make you more likely to develop this condition: ?Being male. ?Being of older age. ?Naturally producing more earwax. ?Having narrow ear canals. ?Having earwax that is overly thick or sticky. ?Having excess hair in the ear canal. ?Having eczema. ?Being dehydrated. ?What are the signs or symptoms? ?Symptoms of this condition include: ?Reduced or muffled hearing. ?A feeling of fullness in the ear or feeling that the ear is plugged. ?Fluid coming from the ear. ?Ear pain or an itchy ear. ?Ringing in the ear. ?Coughing. ?Balance problems. ?An obvious piece of earwax that can be seen inside the ear canal. ?How is this diagnosed? ?This condition may be diagnosed based on: ?Your symptoms. ?Your medical history. ?An ear exam. During the exam, your health care provider will look into your ear with an instrument called an otoscope. ?You may have tests, including a hearing test. ?How is this treated? ?This condition may be treated by: ?Using ear drops to soften the earwax. ?Having the earwax removed by a health care provider. The  health care provider may: ?Flush the ear with water. ?Use an instrument that has a loop on the end (curette). ?Use a suction device. ?Having surgery to remove the wax buildup. This may be done in severe cases. ?Follow these instructions at home: ? ?Take over-the-counter and prescription medicines only as told by your health care provider. ?Do not put any objects, including cotton swabs, into your ear. You can clean the opening of your ear canal with a washcloth or facial tissue. ?Follow instructions from your health care provider about cleaning your ears. Do not overclean your ears. ?Drink enough fluid to keep your urine pale yellow. This will help to thin the earwax. ?Keep all follow-up visits as told. If earwax builds up in your ears often or if you use hearing aids, consider seeing your health care provider for routine, preventive ear cleanings. Ask your health care provider how often you should schedule your cleanings. ?If you have hearing aids, clean them according to instructions from the manufacturer and your health care provider. ?Contact a health care provider if: ?You have ear pain. ?You develop a fever. ?You have pus or other fluid coming from your ear. ?You have hearing loss. ?You have ringing in your ears that does not go away. ?You feel like the room is spinning (vertigo). ?Your symptoms do not improve with treatment. ?Get help right away if: ?You have bleeding from the affected ear. ?You have severe ear pain. ?Summary ?Earwax can build up in the ear and cause discomfort or hearing loss. ?The most common symptoms of this condition include   reduced or muffled hearing, a feeling of fullness in the ear, or feeling that the ear is plugged. ?This condition may be diagnosed based on your symptoms, your medical history, and an ear exam. ?This condition may be treated by using ear drops to soften the earwax or by having the earwax removed by a health care provider. ?Do not put any objects, including cotton  swabs, into your ear. You can clean the opening of your ear canal with a washcloth or facial tissue. ?This information is not intended to replace advice given to you by your health care provider. Make sure you discuss any questions you have with your health care provider. ?Document Revised: 02/11/2020 Document Reviewed: 02/11/2020 ?Elsevier Patient Education ? 2022 Elsevier Inc. ? ?

## 2022-02-07 NOTE — Progress Notes (Signed)
? ?Subjective:  ? ? Patient ID: Zayven Powe, male    DOB: [birth year], 56 y.o.   MRN: [id number] ? ? ?Chief Complaint: medical management of chronic issues  ?  ? ?HPI: ? ?Othello Dickenson is a 56 y.o. who identifies as a male who was assigned male at birth.  ? ?Social history: ?Lives with: his wife ?Work history: on road truck driver ? ? ?Comes in today for follow up of the following chronic medical issues: ? ?1. Hypertension associated with type 2 diabetes mellitus ([place]) ?No c/o chest pain, sob or headache. Does not check blood pressure at home. ?BP Readings from Last 3 Encounters:  ?06/21/21 (!) 151/81  ?03/15/21 134/82  ?01/20/21 124/69  ? ? ? ?2. Type 2 diabetes mellitus without complication, without long-term current use of insulin ([place]) ?He does not check his blood sugars. Doe snot wathc diet very closely. ?Lab Results  ?Component Value Date  ? HGBA1C 6.2 06/21/2021  ? ? ? ?3. Hyperlipidemia associated with type 2 diabetes mellitus ([place]) ?Does not watch diet very closely. Does no dedicated exercise. ?Lab Results  ?Component Value Date  ? CHOL 173 06/21/2021  ? HDL 35 (L) 06/21/2021  ? [place] 98 06/21/2021  ? TRIG 237 (H) 06/21/2021  ? CHOLHDL 4.9 06/21/2021  ? ? ? ?4. Morbid obesity ([place]) ?Weight is up 7lbs ?Wt Readings from Last 3 Encounters:  ?02/07/22 257 lb (116.6 kg)  ?06/21/21 250 lb (113.4 kg)  ?03/15/21 248 lb (112.5 kg)  ? ?BMI Readings from Last 3 Encounters:  ?02/07/22 33.91 kg/m?  ?06/21/21 32.98 kg/m?  ?03/15/21 32.72 kg/m?  ? ? ? ? ?New complaints: ?None today ? ?No Known Allergies ?Outpatient Encounter Medications as of 02/07/2022  ?Medication Sig  ? amoxicillin-clavulanate (AUGMENTIN) 875-125 MG tablet Take 1 tablet by mouth 2 (two) times daily.  ? hydrochlorothiazide (MICROZIDE) 12.5 MG capsule TAKE ONE CAPSULE BY MOUTH DAILY  ? simvastatin (ZOCOR) 40 MG tablet TAKE ONE (1) TABLET EACH DAY  ? ?No facility-administered encounter medications on file as of 02/07/2022.  ? ? ?No past surgical history on  file. ? ?Family History  ?Problem Relation Age of Onset  ? Heart disease Mother   ?     atrial fibrillation  ? Cancer Paternal Aunt   ? Cancer Maternal Grandmother   ?     breast  ? Early death Paternal Grandmother 84  ?     myocardial in  ? Alzheimer's disease Paternal Grandfather   ? Kidney Stones Brother   ? Thyroid disease Brother   ?     produce too much calcium which cause kidney stones  ? ? ? ? ?Controlled substance contract: n/a ? ? ? ? ?Review of Systems  ?Constitutional:  Negative for diaphoresis.  ?Eyes:  Negative for pain.  ?Respiratory:  Negative for shortness of breath.   ?Cardiovascular:  Negative for chest pain, palpitations and leg swelling.  ?Gastrointestinal:  Negative for abdominal pain.  ?Endocrine: Negative for polydipsia.  ?Skin:  Negative for rash.  ?Neurological:  Negative for dizziness, weakness and headaches.  ?Hematological:  Does not bruise/bleed easily.  ?All other systems reviewed and are negative. ? ?   ?Objective:  ? Physical Exam ?Vitals and nursing note reviewed.  ?Constitutional:   ?   Appearance: Normal appearance. He is well-developed.  ?HENT:  ?   Head: Normocephalic.  ?   Nose: Nose normal.  ?   Mouth/Throat:  ?   Mouth: Mucous membranes are moist.  ?  Pharynx: Oropharynx is clear.  ?Eyes:  ?   Pupils: Pupils are equal, round, and reactive to light.  ?Neck:  ?   Thyroid: No thyroid mass or thyromegaly.  ?   Vascular: No carotid bruit or JVD.  ?   Trachea: Phonation normal.  ?Cardiovascular:  ?   Rate and Rhythm: Normal rate and regular rhythm.  ?Pulmonary:  ?   Effort: Pulmonary effort is normal. No respiratory distress.  ?   Breath sounds: Normal breath sounds.  ?Abdominal:  ?   General: Bowel sounds are normal.  ?   Palpations: Abdomen is soft.  ?   Tenderness: There is no abdominal tenderness.  ?Musculoskeletal:     ?   General: Normal range of motion.  ?   Cervical back: Normal range of motion and neck supple.  ?Lymphadenopathy:  ?   Cervical: No cervical adenopathy.   ?Skin: ?   General: Skin is warm and dry.  ?Neurological:  ?   Mental Status: He is alert and oriented to person, place, and time.  ?Psychiatric:     ?   Behavior: Behavior normal.     ?   Thought Content: Thought content normal.     ?   Judgment: Judgment normal.  ? ? ?BP (!) 162/82   Pulse 72   Temp 98 ?F (36.7 ?C)   Resp 20   Ht 6' 1"  (1.854 m)   Wt 257 lb (116.6 kg)   SpO2 97%   BMI 33.91 kg/m?  ? ?S/p bil ear irrigation-TMS clear bil ?Hgba1c 6.9% ? ?   ?Assessment & Plan:  ?Gorge Almanza comes in today with chief complaint of Medical Management of Chronic Issues ? ? ?Diagnosis and orders addressed: ? ?1. Hypertension associated with type 2 diabetes mellitus ([place]) ?Low sodium diet ?Increased HCTZ to 80m today ?- CBC with Differential/Platelet ?- CMP14+EGFR ?- Microalbumin / creatinine urine ratio ?- hydrochlorothiazide (HYDRODIURIL) 25 MG tablet; Take 1 tablet (25 mg total) by mouth daily.  Dispense: 90 tablet; Refill: 3 ? ?2. Type 2 diabetes mellitus without complication, without long-term current use of insulin ([place] ?Continue to watch carbs in diet ?- Bayer DCA Hb A1c Waived ?- Microalbumin / creatinine urine ratio ? ?3. Hyperlipidemia associated with type 2 diabetes mellitus ([place] ?Low fat diet ?- Lipid panel ?- simvastatin (ZOCOR) 40 MG tablet; Take 1 tablet (40 mg total) by mouth daily at 6 PM.  Dispense: 90 tablet; Refill: 1 ? ?4. Morbid obesity ([place] ?Discussed diet and exercise for person with BMI >25 ?Will recheck weight in 3-6 months ? ?5. Bilateral impacted cerumen ?Debrox 2-3 x a week ? ? ?Labs pending ?Health Maintenance reviewed ?Diet and exercise encouraged ? ?Follow up plan: ?6 months ? ? ?Mary-Margaret MHassell Done FNP ? ? ?

## 2022-02-08 LAB — LIPID PANEL
Chol/HDL Ratio: 4.6 ratio (ref 0.0–5.0)
Cholesterol, Total: 165 mg/dL (ref 100–199)
HDL: 36 mg/dL — ABNORMAL LOW (ref >39)
LDL Chol Calc (NIH): 71 mg/dL (ref 0–99)
Triglycerides: 363 mg/dL — ABNORMAL HIGH (ref 0–149)
VLDL Cholesterol Cal: 58 mg/dL — ABNORMAL HIGH (ref 5–40)

## 2022-02-08 LAB — CBC WITH DIFFERENTIAL/PLATELET
Basophils Absolute: 0.1 10*3/uL (ref 0.0–0.2)
Basos: 1 %
EOS (ABSOLUTE): 0.2 10*3/uL (ref 0.0–0.4)
Eos: 3 %
Hematocrit: 48.5 % (ref 37.5–51.0)
Hemoglobin: 16.5 g/dL (ref 13.0–17.7)
Immature Grans (Abs): 0 10*3/uL (ref 0.0–0.1)
Immature Granulocytes: 0 %
Lymphocytes Absolute: 2.2 10*3/uL (ref 0.7–3.1)
Lymphs: 25 %
MCH: 30.4 pg (ref 26.6–33.0)
MCHC: 34 g/dL (ref 31.5–35.7)
MCV: 90 fL (ref 79–97)
Monocytes Absolute: 0.9 10*3/uL (ref 0.1–0.9)
Monocytes: 10 %
Neutrophils Absolute: 5.4 10*3/uL (ref 1.4–7.0)
Neutrophils: 61 %
Platelets: 241 10*3/uL (ref 150–450)
RBC: 5.42 x10E6/uL (ref 4.14–5.80)
RDW: 13.9 % (ref 11.6–15.4)
WBC: 8.8 10*3/uL (ref 3.4–10.8)

## 2022-02-08 LAB — CMP14+EGFR
ALT: 19 IU/L (ref 0–44)
AST: 21 IU/L (ref 0–40)
Albumin/Globulin Ratio: 2 (ref 1.2–2.2)
Albumin: 4.7 g/dL (ref 3.8–4.9)
Alkaline Phosphatase: 74 IU/L (ref 44–121)
BUN/Creatinine Ratio: 18 (ref 9–20)
BUN: 13 mg/dL (ref 6–24)
Bilirubin Total: 0.4 mg/dL (ref 0.0–1.2)
CO2: 24 mmol/L (ref 20–29)
Calcium: 9.8 mg/dL (ref 8.7–10.2)
Chloride: 96 mmol/L (ref 96–106)
Creatinine, Ser: 0.74 mg/dL — ABNORMAL LOW (ref 0.76–1.27)
Globulin, Total: 2.3 g/dL (ref 1.5–4.5)
Glucose: 155 mg/dL — ABNORMAL HIGH (ref 70–99)
Potassium: 4.2 mmol/L (ref 3.5–5.2)
Sodium: 138 mmol/L (ref 134–144)
Total Protein: 7 g/dL (ref 6.0–8.5)
eGFR: 107 mL/min/{1.73_m2} (ref >59)

## 2022-02-08 LAB — MICROALBUMIN / CREATININE URINE RATIO
Creatinine, Urine: 82 mg/dL
Microalb/Creat Ratio: 24 mg/g creat (ref 0–29)
Microalbumin, Urine: 19.9 ug/mL

## 2022-02-27 LAB — COLOGUARD: COLOGUARD: NEGATIVE

## 2022-08-22 ENCOUNTER — Encounter: Payer: Self-pay | Admitting: Nurse Practitioner

## 2022-08-22 ENCOUNTER — Ambulatory Visit: Payer: Managed Care, Other (non HMO) | Admitting: Nurse Practitioner

## 2022-08-22 VITALS — BP 142/78 | HR 53 | Temp 98.2°F | Resp 20 | Ht 73.0 in | Wt 245.0 lb

## 2022-08-22 DIAGNOSIS — E119 Type 2 diabetes mellitus without complications: Secondary | ICD-10-CM

## 2022-08-22 DIAGNOSIS — E1169 Type 2 diabetes mellitus with other specified complication: Secondary | ICD-10-CM | POA: Diagnosis not present

## 2022-08-22 DIAGNOSIS — I152 Hypertension secondary to endocrine disorders: Secondary | ICD-10-CM

## 2022-08-22 DIAGNOSIS — E1159 Type 2 diabetes mellitus with other circulatory complications: Secondary | ICD-10-CM

## 2022-08-22 DIAGNOSIS — E785 Hyperlipidemia, unspecified: Secondary | ICD-10-CM

## 2022-08-22 DIAGNOSIS — Z6832 Body mass index (BMI) 32.0-32.9, adult: Secondary | ICD-10-CM

## 2022-08-22 DIAGNOSIS — Z125 Encounter for screening for malignant neoplasm of prostate: Secondary | ICD-10-CM

## 2022-08-22 LAB — BAYER DCA HB A1C WAIVED: HB A1C (BAYER DCA - WAIVED): 6.7 % — ABNORMAL HIGH (ref 4.8–5.6)

## 2022-08-22 NOTE — Patient Instructions (Signed)
Exercising to Stay Healthy To become healthy and stay healthy, it is recommended that you do moderate-intensity and vigorous-intensity exercise. You can tell that you are exercising at a moderate intensity if your heart starts beating faster and you start breathing faster but can still hold a conversation. You can tell that you are exercising at a vigorous intensity if you are breathing much harder and faster and cannot hold a conversation while exercising. How can exercise benefit me? Exercising regularly is important. It has many health benefits, such as: Improving overall fitness, flexibility, and endurance. Increasing bone density. Helping with weight control. Decreasing body fat. Increasing muscle strength and endurance. Reducing stress and tension, anxiety, depression, or anger. Improving overall health. What guidelines should I follow while exercising? Before you start a new exercise program, talk with your health care provider. Do not exercise so much that you hurt yourself, feel dizzy, or get very short of breath. Wear comfortable clothes and wear shoes with good support. Drink plenty of water while you exercise to prevent dehydration or heat stroke. Work out until your breathing and your heartbeat get faster (moderate intensity). How often should I exercise? Choose an activity that you enjoy, and set realistic goals. Your health care provider can help you make an activity plan that is individually designed and works best for you. Exercise regularly as told by your health care provider. This may include: Doing strength training two times a week, such as: Lifting weights. Using resistance bands. Push-ups. Sit-ups. Yoga. Doing a certain intensity of exercise for a given amount of time. Choose from these options: A total of 150 minutes of moderate-intensity exercise every week. A total of 75 minutes of vigorous-intensity exercise every week. A mix of moderate-intensity and  vigorous-intensity exercise every week. Children, pregnant women, people who have not exercised regularly, people who are overweight, and older adults may need to talk with a health care provider about what activities are safe to perform. If you have a medical condition, be sure to talk with your health care provider before you start a new exercise program. What are some exercise ideas? Moderate-intensity exercise ideas include: Walking 1 mile (1.6 km) in about 15 minutes. Biking. Hiking. Golfing. Dancing. Water aerobics. Vigorous-intensity exercise ideas include: Walking 4.5 miles (7.2 km) or more in about 1 hour. Jogging or running 5 miles (8 km) in about 1 hour. Biking 10 miles (16.1 km) or more in about 1 hour. Lap swimming. Roller-skating or in-line skating. Cross-country skiing. Vigorous competitive sports, such as football, basketball, and soccer. Jumping rope. Aerobic dancing. What are some everyday activities that can help me get exercise? Yard work, such as: Pushing a lawn mower. Raking and bagging leaves. Washing your car. Pushing a stroller. Shoveling snow. Gardening. Washing windows or floors. How can I be more active in my day-to-day activities? Use stairs instead of an elevator. Take a walk during your lunch break. If you drive, park your car farther away from your work or school. If you take public transportation, get off one stop early and walk the rest of the way. Stand up or walk around during all of your indoor phone calls. Get up, stretch, and walk around every 30 minutes throughout the day. Enjoy exercise with a friend. Support to continue exercising will help you keep a regular routine of activity. Where to find more information You can find more information about exercising to stay healthy from: U.S. Department of Health and Human Services: www.hhs.gov Centers for Disease Control and Prevention (  CDC): www.cdc.gov Summary Exercising regularly is  important. It will improve your overall fitness, flexibility, and endurance. Regular exercise will also improve your overall health. It can help you control your weight, reduce stress, and improve your bone density. Do not exercise so much that you hurt yourself, feel dizzy, or get very short of breath. Before you start a new exercise program, talk with your health care provider. This information is not intended to replace advice given to you by your health care provider. Make sure you discuss any questions you have with your health care provider. Document Revised: 02/19/2021 Document Reviewed: 02/19/2021 Elsevier Patient Education  2023 Elsevier Inc.  

## 2022-08-22 NOTE — Progress Notes (Signed)
Subjective:    Patient ID: Austin Smith, male    DOB: 1965-12-08, 56 y.o.   MRN: [id number]   Chief Complaint: medical management of chronic issues     HPI:  Austin Smith is a 56 y.o. who identifies as a male who was assigned male at birth.   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 Hypertension associated with type 2 diabetes mellitus (Austin Smith) No c/o chest pain, sob or headache. Does not check blood pressure at home. BP Readings from Last 3 Encounters:  02/07/22 (!) 162/82  06/21/21 (!) 151/81  03/15/21 134/82      2. Hyperlipidemia associated with type 2 diabetes mellitus (Austin Smith) Does not watch diet and does try to walk for  exercise. Lab Results  Component Value Date   CHOL 165 02/07/2022   HDL 36 (L) 02/07/2022   LDLCALC 71 02/07/2022   TRIG 363 (H) 02/07/2022   CHOLHDL 4.6 02/07/2022      3. Type 2 diabetes mellitus without complication, without long-term current use of insulin ([name]) Does not check blood sugar very often. Lab Results  Component Value Date   HGBA1C 6.9 (H) 02/07/2022     4. Morbid obesity (Austin Smith) Weight is down 12lbs Wt Readings from Last 3 Encounters:  08/22/22 245 lb (111.1 kg)  02/07/22 257 lb (116.6 kg)  06/21/21 250 lb (113.4 kg)   BMI Readings from Last 3 Encounters:  08/22/22 32.32 kg/m  02/07/22 33.91 kg/m  06/21/21 32.98 kg/m     New complaints: None today  No Known Allergies Outpatient Encounter Medications as of 08/22/2022  Medication Sig   hydrochlorothiazide (HYDRODIURIL) 25 MG tablet Take 1 tablet (25 mg total) by mouth daily.   simvastatin (ZOCOR) 40 MG tablet Take 1 tablet (40 mg total) by mouth daily at 6 PM.   No facility-administered encounter medications on file as of 08/22/2022.    No past surgical history on file.  Family History  Problem Relation Age of Onset   Heart disease Mother        atrial  fibrillation   Cancer Paternal Aunt    Cancer Maternal Grandmother        breast   Early death Paternal Grandmother 66       myocardial in   Alzheimer's disease Paternal Grandfather    Kidney Stones Brother    Thyroid disease Brother        produce too much calcium which cause kidney stones      Controlled substance contract: n/a      Review of Systems  Constitutional:  Negative for diaphoresis.  Eyes:  Negative for pain.  Respiratory:  Negative for shortness of breath.   Cardiovascular:  Negative for chest pain, palpitations and leg swelling.  Gastrointestinal:  Negative for abdominal pain.  Endocrine: Negative for polydipsia.  Skin:  Negative for rash.  Neurological:  Negative for dizziness, weakness and headaches.  Hematological:  Does not bruise/bleed easily.  All other systems reviewed and are negative.      Objective:   Physical Exam Vitals and nursing note reviewed.  Constitutional:      Appearance: Normal appearance. He is well-developed.  HENT:     Head: Normocephalic.     Nose: Nose normal.     Mouth/Throat:     Mouth: Mucous membranes are moist.     Pharynx: Oropharynx is clear.  Eyes:  Pupils: Pupils are equal, round, and reactive to light.  Neck:     Thyroid: No thyroid mass or thyromegaly.     Vascular: No carotid bruit or JVD.     Trachea: Phonation normal.  Cardiovascular:     Rate and Rhythm: Normal rate and regular rhythm.  Pulmonary:     Effort: Pulmonary effort is normal. No respiratory distress.     Breath sounds: Normal breath sounds.  Abdominal:     General: Bowel sounds are normal.     Palpations: Abdomen is soft.     Tenderness: There is no abdominal tenderness.  Musculoskeletal:        General: Normal range of motion.     Cervical back: Normal range of motion and neck supple.  Lymphadenopathy:     Cervical: No cervical adenopathy.  Skin:    General: Skin is warm and dry.  Neurological:     Mental Status: He is alert and  oriented to person, place, and time.  Psychiatric:        Behavior: Behavior normal.        Thought Content: Thought content normal.        Judgment: Judgment normal.    BP (!) 152/84   Pulse (!) 53   Temp 98.2 F (36.8 C) (Temporal)   Resp 20   Ht _0  (1.854 m)   Wt 245 lb (111.1 kg)   SpO2 98%   BMI 32.32 kg/m   HGBA1c 6.7%       Assessment & Plan:   Austin Smith comes in today with chief complaint of Medical Management of Chronic Issues   Diagnosis and orders addressed:  1. Hypertension associated with type 2 diabetes mellitus ([name]) Low sodium diet - CBC with Differential/Platelet - CMP14+EGFR  2. Hyperlipidemia associated with type 2 diabetes mellitus ([name]) Low fta diet - Lipid panel  3. Type 2 diabetes mellitus without complication, without long-term current use of insulin ([name]) Continue to watch carbs in diet - Bayer DCA Hb A1c Waived  4. BMI 32.0-32.9 Discussed diet and exercise for person with BMI >25 Will recheck weight in 3-6 months   5. Screening for prostate cancer - PSA, total and free   Labs pending Health Maintenance reviewed Diet and exercise encouraged  Follow up plan: 6 months   Prien, FNP

## 2022-08-23 LAB — CBC WITH DIFFERENTIAL/PLATELET
Basophils Absolute: 0.1 10*3/uL (ref 0.0–0.2)
Basos: 1 %
EOS (ABSOLUTE): 0.2 10*3/uL (ref 0.0–0.4)
Eos: 4 %
Hematocrit: 45.4 % (ref 37.5–51.0)
Hemoglobin: 15.2 g/dL (ref 13.0–17.7)
Immature Grans (Abs): 0 10*3/uL (ref 0.0–0.1)
Immature Granulocytes: 0 %
Lymphocytes Absolute: 2 10*3/uL (ref 0.7–3.1)
Lymphs: 30 %
MCH: 29.8 pg (ref 26.6–33.0)
MCHC: 33.5 g/dL (ref 31.5–35.7)
MCV: 89 fL (ref 79–97)
Monocytes Absolute: 0.7 10*3/uL (ref 0.1–0.9)
Monocytes: 10 %
Neutrophils Absolute: 3.7 10*3/uL (ref 1.4–7.0)
Neutrophils: 55 %
Platelets: 214 10*3/uL (ref 150–450)
RBC: 5.1 x10E6/uL (ref 4.14–5.80)
RDW: 15.5 % — ABNORMAL HIGH (ref 11.6–15.4)
WBC: 6.6 10*3/uL (ref 3.4–10.8)

## 2022-08-23 LAB — CMP14+EGFR
ALT: 16 IU/L (ref 0–44)
AST: 20 IU/L (ref 0–40)
Albumin/Globulin Ratio: 1.8 (ref 1.2–2.2)
Albumin: 4.4 g/dL (ref 3.8–4.9)
Alkaline Phosphatase: 72 IU/L (ref 44–121)
BUN/Creatinine Ratio: 18 (ref 9–20)
BUN: 14 mg/dL (ref 6–24)
Bilirubin Total: 0.3 mg/dL (ref 0.0–1.2)
CO2: 25 mmol/L (ref 20–29)
Calcium: 9.6 mg/dL (ref 8.7–10.2)
Chloride: 99 mmol/L (ref 96–106)
Creatinine, Ser: 0.76 mg/dL (ref 0.76–1.27)
Globulin, Total: 2.4 g/dL (ref 1.5–4.5)
Glucose: 122 mg/dL — ABNORMAL HIGH (ref 70–99)
Potassium: 4.1 mmol/L (ref 3.5–5.2)
Sodium: 140 mmol/L (ref 134–144)
Total Protein: 6.8 g/dL (ref 6.0–8.5)
eGFR: 105 mL/min/{1.73_m2} (ref >59)

## 2022-08-23 LAB — LIPID PANEL
Chol/HDL Ratio: 4.4 ratio (ref 0.0–5.0)
Cholesterol, Total: 164 mg/dL (ref 100–199)
HDL: 37 mg/dL — ABNORMAL LOW (ref >39)
LDL Chol Calc (NIH): 85 mg/dL (ref 0–99)
Triglycerides: 255 mg/dL — ABNORMAL HIGH (ref 0–149)
VLDL Cholesterol Cal: 42 mg/dL — ABNORMAL HIGH (ref 5–40)

## 2022-08-23 LAB — PSA, TOTAL AND FREE
PSA, Free Pct: 46.7 %
PSA, Free: 0.14 ng/mL
Prostate Specific Ag, Serum: 0.3 ng/mL (ref 0.0–4.0)

## 2022-10-22 ENCOUNTER — Other Ambulatory Visit: Payer: Self-pay | Admitting: Nurse Practitioner

## 2022-10-22 DIAGNOSIS — E1169 Type 2 diabetes mellitus with other specified complication: Secondary | ICD-10-CM

## 2022-11-28 ENCOUNTER — Other Ambulatory Visit: Payer: Self-pay | Admitting: Nurse Practitioner

## 2022-11-28 DIAGNOSIS — I152 Hypertension secondary to endocrine disorders: Secondary | ICD-10-CM

## 2023-02-20 ENCOUNTER — Encounter: Payer: Self-pay | Admitting: Nurse Practitioner

## 2023-02-20 ENCOUNTER — Ambulatory Visit: Payer: Managed Care, Other (non HMO) | Admitting: Nurse Practitioner

## 2023-02-20 VITALS — BP 143/80 | HR 53 | Temp 97.9°F | Resp 20 | Ht 73.0 in | Wt 237.0 lb

## 2023-02-20 DIAGNOSIS — E1169 Type 2 diabetes mellitus with other specified complication: Secondary | ICD-10-CM

## 2023-02-20 DIAGNOSIS — E1159 Type 2 diabetes mellitus with other circulatory complications: Secondary | ICD-10-CM

## 2023-02-20 DIAGNOSIS — E785 Hyperlipidemia, unspecified: Secondary | ICD-10-CM

## 2023-02-20 DIAGNOSIS — E119 Type 2 diabetes mellitus without complications: Secondary | ICD-10-CM | POA: Diagnosis not present

## 2023-02-20 DIAGNOSIS — I152 Hypertension secondary to endocrine disorders: Secondary | ICD-10-CM

## 2023-02-20 LAB — BAYER DCA HB A1C WAIVED: HB A1C (BAYER DCA - WAIVED): 6.7 % — ABNORMAL HIGH (ref 4.8–5.6)

## 2023-02-20 MED ORDER — SIMVASTATIN 40 MG PO TABS
ORAL_TABLET | ORAL | 1 refills | Status: DC
Start: 2023-02-20 — End: 2023-10-03

## 2023-02-20 MED ORDER — HYDROCHLOROTHIAZIDE 25 MG PO TABS: 25.0000 mg | ORAL_TABLET | Freq: Every day | ORAL | 1 refills | Status: DC

## 2023-02-20 NOTE — Patient Instructions (Signed)

## 2023-02-20 NOTE — Progress Notes (Signed)
Subjective:    Patient ID: Austin Smith, male    DOB: 10-11-1966, 57 y.o.   MRN: [id number]   Chief Complaint: medical management of chronic issues     HPI:  Austin Smith is a 57 y.o. who identifies as a male who was assigned male at birth.   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 Hypertension associated with type 2 diabetes mellitus No c/o chest pain, sob or headache.does not check blood pressure at home. BP Readings from Last 3 Encounters:  02/20/23 (!) 143/80  08/22/22 (!) 142/78  02/07/22 (!) 162/82        2. Hyperlipidemia associated with type 2 diabetes mellitus Does not watch diet and does no dedicated exercise Lab Results  Component Value Date   CHOL 164 08/22/2022   HDL 37 (L) 08/22/2022   LDLCALC 85 08/22/2022   TRIG 255 (H) 08/22/2022   CHOLHDL 4.4 08/22/2022     3. Type 2 diabetes mellitus without complication, without long-term current use of insulin fasting blood sugars are running around 120-140. He does not check his blood sugars very often. Denies any low blood sugars Lab Results  Component Value Date   HGBA1C 6.7 (H) 08/22/2022     4. Morbid obesity No recent weight changes Wt Readings from Last 3 Encounters:  02/20/23 237 lb (107.5 kg)  08/22/22 245 lb (111.1 kg)  02/07/22 257 lb (116.6 kg)   BMI Readings from Last 3 Encounters:  02/20/23 31.27 kg/m  08/22/22 32.32 kg/m  02/07/22 33.91 kg/m     New complaints: None today  No Known Allergies Outpatient Encounter Medications as of 02/20/2023  Medication Sig   hydrochlorothiazide (HYDRODIURIL) 25 MG tablet TAKE ONE (1) TABLET BY MOUTH EVERY DAY   simvastatin (ZOCOR) 40 MG tablet TAKE ONE (1) TABLET EACH DAY   No facility-administered encounter medications on file as of 02/20/2023.    History reviewed. No pertinent surgical history.  Family History  Problem Relation Age of  Onset   Heart disease Mother        atrial fibrillation   Cancer Paternal Aunt    Cancer Maternal Grandmother        breast   Early death Paternal Grandmother 59       myocardial in   Alzheimer's disease Paternal Grandfather    Kidney Stones Brother    Thyroid disease Brother        produce too much calcium which cause kidney stones      Controlled substance contract: n/a      Review of Systems  Constitutional:  Negative for diaphoresis.  Eyes:  Negative for pain.  Respiratory:  Negative for shortness of breath.   Cardiovascular:  Negative for chest pain, palpitations and leg swelling.  Gastrointestinal:  Negative for abdominal pain.  Endocrine: Negative for polydipsia.  Skin:  Negative for rash.  Neurological:  Negative for dizziness, weakness and headaches.  Hematological:  Does not bruise/bleed easily.  All other systems reviewed and are negative.      Objective:   Physical Exam Vitals and nursing note reviewed.  Constitutional:      Appearance: Normal appearance. He is well-developed.  HENT:     Head: Normocephalic.     Nose: Nose normal.     Mouth/Throat:     Mouth: Mucous membranes are moist.     Pharynx: Oropharynx is clear.  Eyes:  Pupils: Pupils are equal, round, and reactive to light.  Neck:     Thyroid: No thyroid mass or thyromegaly.     Vascular: No carotid bruit or JVD.     Trachea: Phonation normal.  Cardiovascular:     Rate and Rhythm: Normal rate and regular rhythm.  Pulmonary:     Effort: Pulmonary effort is normal. No respiratory distress.     Breath sounds: Normal breath sounds.  Abdominal:     General: Bowel sounds are normal.     Palpations: Abdomen is soft.     Tenderness: There is no abdominal tenderness.  Musculoskeletal:        General: Normal range of motion.     Cervical back: Normal range of motion and neck supple.  Lymphadenopathy:     Cervical: No cervical adenopathy.  Skin:    General: Skin is warm and dry.   Neurological:     Mental Status: He is alert and oriented to person, place, and time.  Psychiatric:        Behavior: Behavior normal.        Thought Content: Thought content normal.        Judgment: Judgment normal.    BP (!) 143/80   Pulse (!) 53   Temp 97.9 F (36.6 C) (Temporal)   Resp 20   Ht 6\' 1"  (1.854 m)   Wt 237 lb (107.5 kg)   SpO2 96%   BMI 31.27 kg/m   HGBA1c 6.7%      Assessment & Plan:   Austin Smith comes in today with chief complaint of No chief complaint on file.   Diagnosis and orders addressed:  1. Hypertension associated with type 2 diabetes mellitus Low sodium diet - CBC with Differential/Platelet - CMP14+EGFR - hydrochlorothiazide (HYDRODIURIL) 25 MG tablet; Take 1 tablet (25 mg total) by mouth daily.  Dispense: 90 tablet; Refill: 1  2. Hyperlipidemia associated with type 2 diabetes mellitus Low fat diet - Lipid panel - simvastatin (ZOCOR) 40 MG tablet; TAKE ONE (1) TABLET EACH DAY  Dispense: 90 tablet; Refill: 1  3. Type 2 diabetes mellitus without complication, without long-term current use of insulin Continue to watch carbs in diet - Bayer DCA Hb A1c Waived - Microalbumin / creatinine urine ratio  4. Morbid obesity Discussed diet and exercise for person with BMI >25 Will recheck weight in 3-6 months    Labs pending Health Maintenance reviewed Diet and exercise encouraged  Follow up plan: 6 months   Mary-Margaret Daphine Deutscher, FNP

## 2023-02-21 LAB — CBC WITH DIFFERENTIAL/PLATELET
Basophils Absolute: 0.1 10*3/uL (ref 0.0–0.2)
Basos: 1 %
EOS (ABSOLUTE): 0.3 10*3/uL (ref 0.0–0.4)
Eos: 4 %
Hematocrit: 43.6 % (ref 37.5–51.0)
Hemoglobin: 14.4 g/dL (ref 13.0–17.7)
Immature Grans (Abs): 0 10*3/uL (ref 0.0–0.1)
Immature Granulocytes: 0 %
Lymphocytes Absolute: 2.7 10*3/uL (ref 0.7–3.1)
Lymphs: 36 %
MCH: 29.4 pg (ref 26.6–33.0)
MCHC: 33 g/dL (ref 31.5–35.7)
MCV: 89 fL (ref 79–97)
Monocytes Absolute: 0.8 10*3/uL (ref 0.1–0.9)
Monocytes: 10 %
Neutrophils Absolute: 3.7 10*3/uL (ref 1.4–7.0)
Neutrophils: 49 %
Platelets: 240 10*3/uL (ref 150–450)
RBC: 4.89 x10E6/uL (ref 4.14–5.80)
RDW: 13.3 % (ref 11.6–15.4)
WBC: 7.5 10*3/uL (ref 3.4–10.8)

## 2023-02-21 LAB — LIPID PANEL
Chol/HDL Ratio: 4.5 ratio (ref 0.0–5.0)
Cholesterol, Total: 150 mg/dL (ref 100–199)
HDL: 33 mg/dL — ABNORMAL LOW (ref >39)
LDL Chol Calc (NIH): 71 mg/dL (ref 0–99)
Triglycerides: 282 mg/dL — ABNORMAL HIGH (ref 0–149)
VLDL Cholesterol Cal: 46 mg/dL — ABNORMAL HIGH (ref 5–40)

## 2023-02-21 LAB — CMP14+EGFR
ALT: 16 IU/L (ref 0–44)
AST: 21 IU/L (ref 0–40)
Albumin/Globulin Ratio: 2.1 (ref 1.2–2.2)
Albumin: 4.8 g/dL (ref 3.8–4.9)
Alkaline Phosphatase: 80 IU/L (ref 44–121)
BUN/Creatinine Ratio: 18 (ref 9–20)
BUN: 14 mg/dL (ref 6–24)
Bilirubin Total: 0.6 mg/dL (ref 0.0–1.2)
CO2: 22 mmol/L (ref 20–29)
Calcium: 9.5 mg/dL (ref 8.7–10.2)
Chloride: 101 mmol/L (ref 96–106)
Creatinine, Ser: 0.8 mg/dL (ref 0.76–1.27)
Globulin, Total: 2.3 g/dL (ref 1.5–4.5)
Glucose: 107 mg/dL — ABNORMAL HIGH (ref 70–99)
Potassium: 4.1 mmol/L (ref 3.5–5.2)
Sodium: 144 mmol/L (ref 134–144)
Total Protein: 7.1 g/dL (ref 6.0–8.5)
eGFR: 104 mL/min/{1.73_m2} (ref >59)

## 2023-02-21 LAB — MICROALBUMIN / CREATININE URINE RATIO
Creatinine, Urine: 113.3 mg/dL
Microalb/Creat Ratio: 4 mg/g creat (ref 0–29)
Microalbumin, Urine: 5 ug/mL

## 2023-07-27 ENCOUNTER — Telehealth: Payer: Self-pay | Admitting: Nurse Practitioner

## 2023-07-27 NOTE — Telephone Encounter (Signed)
Pt will ask about it when he comes to apt in October

## 2023-08-23 ENCOUNTER — Ambulatory Visit: Payer: Managed Care, Other (non HMO)

## 2023-08-28 ENCOUNTER — Ambulatory Visit: Payer: Managed Care, Other (non HMO) | Admitting: Nurse Practitioner

## 2023-09-02 ENCOUNTER — Other Ambulatory Visit: Payer: Self-pay | Admitting: Nurse Practitioner

## 2023-09-02 DIAGNOSIS — E1159 Type 2 diabetes mellitus with other circulatory complications: Secondary | ICD-10-CM

## 2023-10-03 ENCOUNTER — Encounter: Payer: Self-pay | Admitting: Nurse Practitioner

## 2023-10-03 ENCOUNTER — Ambulatory Visit: Payer: Managed Care, Other (non HMO) | Admitting: Nurse Practitioner

## 2023-10-03 VITALS — BP 153/82 | HR 50 | Temp 97.7°F | Resp 20 | Ht 73.0 in | Wt 247.0 lb

## 2023-10-03 DIAGNOSIS — E785 Hyperlipidemia, unspecified: Secondary | ICD-10-CM

## 2023-10-03 DIAGNOSIS — H6122 Impacted cerumen, left ear: Secondary | ICD-10-CM | POA: Diagnosis not present

## 2023-10-03 DIAGNOSIS — E1159 Type 2 diabetes mellitus with other circulatory complications: Secondary | ICD-10-CM | POA: Diagnosis not present

## 2023-10-03 DIAGNOSIS — H6123 Impacted cerumen, bilateral: Secondary | ICD-10-CM

## 2023-10-03 DIAGNOSIS — Z125 Encounter for screening for malignant neoplasm of prostate: Secondary | ICD-10-CM

## 2023-10-03 DIAGNOSIS — H6121 Impacted cerumen, right ear: Secondary | ICD-10-CM

## 2023-10-03 DIAGNOSIS — E119 Type 2 diabetes mellitus without complications: Secondary | ICD-10-CM

## 2023-10-03 DIAGNOSIS — I152 Hypertension secondary to endocrine disorders: Secondary | ICD-10-CM

## 2023-10-03 DIAGNOSIS — E1169 Type 2 diabetes mellitus with other specified complication: Secondary | ICD-10-CM | POA: Diagnosis not present

## 2023-10-03 LAB — BAYER DCA HB A1C WAIVED: HB A1C (BAYER DCA - WAIVED): 6.6 % — ABNORMAL HIGH (ref 4.8–5.6)

## 2023-10-03 MED ORDER — SIMVASTATIN 40 MG PO TABS
ORAL_TABLET | ORAL | 1 refills | Status: DC
Start: 2023-10-03 — End: 2024-04-08

## 2023-10-03 MED ORDER — LISINOPRIL-HYDROCHLOROTHIAZIDE 20-25 MG PO TABS
1.0000 | ORAL_TABLET | Freq: Every day | ORAL | 1 refills | Status: DC
Start: 2023-10-03 — End: 2023-12-25

## 2023-10-03 NOTE — Patient Instructions (Signed)
Earwax Buildup, Adult Your ears make something called earwax. It helps keep germs called bacteria away and protects the skin in your ears. Sometimes, too much earwax can build up. This can cause discomfort or make it harder to hear. What are the causes? Earwax buildup can happen when you have too much earwax in your ears. Earwax is made in the outer part of your ear canal. It's supposed to fall out in small amounts over time. But if your ears aren't able to clean themselves like they should, earwax can build up. What increases the risk? You're more likely to get earwax buildup if: You clean your ears with cotton swabs. You pick at your ears. You use earplugs or in-ear headphones a lot. You wear hearing aids. You may also be more likely to get it if: You're male. You're older. Your ears naturally make more earwax. You have narrow ear canals or extra hair in your ears. Your earwax is too thick or sticky. You have eczema. You're dehydrated. This means there's not enough fluid in your body. What are the signs or symptoms? Symptoms of earwax buildup include: Not being able to hear as well. A feeling of fullness in your ear. Feeling like your ear is plugged. Fluid coming from your ear. Ear pain or an itchy ear. Ringing in your ear. Coughing or problems with balance. How is this diagnosed? Earwax buildup may be diagnosed based on your symptoms, medical history, and an ear exam. During the exam, your health care provider will look into your ear with a tool called an otoscope. You may also have tests, such as a hearing test. How is this treated? Earwax buildup may be treated by: Using ear drops. Having the earwax removed by a provider. The provider may: Flush the ear with water. Use a tool called a curette that has a loop on the end. Use a suction device. Having surgery. This may be done in severe cases. Follow these instructions at home:  Cleaning your ears Clean your ears as told  by your provider. You can clean the outside of your ears with a washcloth or tissue. Do not overclean your ears. Do not put anything into your ear unless told. This includes cotton swabs. General instructions Take over-the-counter and prescription medicines only as told by your provider. Drink enough fluid to keep your pee (urine) pale yellow. This helps thin the earwax. If you have hearing aids, clean them as told. Keep all follow-up visits. If earwax builds up in your ears often or if you use hearing aids, ask your provider how often you should have your ears cleaned. Contact a health care provider if: Your ear pain gets worse. You have a fever. You have pus, blood, or other fluid coming from your ear. You have hearing loss. You have ringing in your ears that won't go away. You feel like the room is spinning. This is called vertigo. Your symptoms don't get better with treatment. This information is not intended to replace advice given to you by your health care provider. Make sure you discuss any questions you have with your health care provider. Document Revised: 01/05/2023 Document Reviewed: 01/05/2023 Elsevier Patient Education  2024 [employer].

## 2023-10-03 NOTE — Progress Notes (Signed)
Subjective:    Patient ID: Austin Smith, male    DOB: [birth year], 57 y.o.   MRN: [id number]   Chief Complaint: medical management of chronic issues     HPI:  Austin Smith is a 57 y.o. who identifies as a male who was assigned male at birth.   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 Hypertension associated with type 2 diabetes mellitus ([name]) No c/o chest pain, sob or headache. Does not check blood pressure at home BP Readings from Last 3 Encounters:  02/20/23 (!) 143/80  08/22/22 (!) 142/78  02/07/22 (!) 162/82     2. Hyperlipidemia associated with type 2 diabetes mellitus ([name]) Does not watch his diet and does very little exercise. Lab Results  Component Value Date   CHOL 150 02/20/2023   HDL 33 (L) 02/20/2023   LDLCALC 71 02/20/2023   TRIG 282 (H) 02/20/2023   CHOLHDL 4.5 02/20/2023     3. Type 2 diabetes mellitus without complication, without long-term current use of insulin ([name]) Fasting blood sugars are running around 110-120. No low blood sugars. Lab Results  Component Value Date   HGBA1C 6.7 (H) 02/20/2023     4. Morbid obesity ([name]) Weight is up 10 lbs Wt Readings from Last 3 Encounters:  10/03/23 247 lb (112 kg)  02/20/23 237 lb (107.5 kg)  08/22/22 245 lb (111.1 kg)   BMI Readings from Last 3 Encounters:  10/03/23 32.59 kg/m  02/20/23 31.27 kg/m  08/22/22 32.32 kg/m     New complaints: None today  No Known Allergies Outpatient Encounter Medications as of 10/03/2023  Medication Sig   hydrochlorothiazide (HYDRODIURIL) 25 MG tablet TAKE ONE (1) TABLET BY MOUTH EVERY DAY   simvastatin (ZOCOR) 40 MG tablet TAKE ONE (1) TABLET EACH DAY   No facility-administered encounter medications on file as of 10/03/2023.    No past surgical history on file.  Family History  Problem Relation Age of Onset   Heart disease Mother        atrial  fibrillation   Cancer Paternal Aunt    Cancer Maternal Grandmother        breast   Early death Paternal Grandmother 28       myocardial in   Alzheimer's disease Paternal Grandfather    Kidney Stones Brother    Thyroid disease Brother        produce too much calcium which cause kidney stones      Controlled substance contract: n/a     Review of Systems  Constitutional:  Negative for diaphoresis.  Eyes:  Negative for pain.  Respiratory:  Negative for shortness of breath.   Cardiovascular:  Negative for chest pain, palpitations and leg swelling.  Gastrointestinal:  Negative for abdominal pain.  Endocrine: Negative for polydipsia.  Skin:  Negative for rash.  Neurological:  Negative for dizziness, weakness and headaches.  Hematological:  Does not bruise/bleed easily.  All other systems reviewed and are negative.      Objective:   Physical Exam Vitals and nursing note reviewed.  Constitutional:      Appearance: Normal appearance. He is well-developed.  HENT:     Head: Normocephalic.     Right Ear: There is impacted cerumen.     Left Ear: There is impacted cerumen.     Nose: Nose normal.     Mouth/Throat:     Mouth: Mucous membranes are  moist.     Pharynx: Oropharynx is clear.  Eyes:     Pupils: Pupils are equal, round, and reactive to light.  Neck:     Thyroid: No thyroid mass or thyromegaly.     Vascular: No carotid bruit or JVD.     Trachea: Phonation normal.  Cardiovascular:     Rate and Rhythm: Normal rate and regular rhythm.  Pulmonary:     Effort: Pulmonary effort is normal. No respiratory distress.     Breath sounds: Normal breath sounds.  Abdominal:     General: Bowel sounds are normal.     Palpations: Abdomen is soft.     Tenderness: There is no abdominal tenderness.  Musculoskeletal:        General: Normal range of motion.     Cervical back: Normal range of motion and neck supple.  Lymphadenopathy:     Cervical: No cervical adenopathy.  Skin:     General: Skin is warm and dry.  Neurological:     Mental Status: He is alert and oriented to person, place, and time.  Psychiatric:        Behavior: Behavior normal.        Thought Content: Thought content normal.        Judgment: Judgment normal.     BP (!) 153/82   Pulse (!) 50   Temp 97.7 F (36.5 C) (Temporal)   Resp 20   Ht 6\' 1"  (1.854 m)   Wt 247 lb (112 kg)   SpO2 98%   BMI 32.59 kg/m   Hgba1c 6.6% Bil ear lavage and curretting    Assessment & Plan:   Austin Smith comes in today with chief complaint of Medical Management of Chronic Issues   Diagnosis and orders addressed:  1. Hypertension associated with type 2 diabetes mellitus ([name]) Low sodium diet - CBC with Differential/Platelet - CMP14+EGFR - aspirin EC 81 MG tablet; Take 81 mg by mouth daily. Swallow whole. - lisinopril-hydrochlorothiazide (ZESTORETIC) 20-25 MG tablet; Take 1 tablet by mouth daily.  Dispense: 90 tablet; Refill: 1  2. Hyperlipidemia associated with type 2 diabetes mellitus ([name]) Low fat diet - Lipid panel - simvastatin (ZOCOR) 40 MG tablet; TAKE ONE (1) TABLET EACH DAY  Dispense: 90 tablet; Refill: 1  3. Type 2 diabetes mellitus without complication, without long-term current use of insulin ([name]) Continue to watch carbs in diet - Bayer DCA Hb A1c Waived  4. Morbid obesity ([name]) Discussed diet and exercise for person with BMI >25 Will recheck weight in 3-6 months   5. Screening for prostate cancer Labs pending - PSA, total and free  6. Bilateral impacted cerumen Debrox several days aw week  Labs pending Health Maintenance reviewed Diet and exercise encouraged  Follow up plan: 6 months   Mary-Margaret Daphine Deutscher, FNP

## 2023-10-04 LAB — CBC WITH DIFFERENTIAL/PLATELET
Basophils Absolute: 0.1 10*3/uL (ref 0.0–0.2)
Basos: 1 %
EOS (ABSOLUTE): 0.3 10*3/uL (ref 0.0–0.4)
Eos: 3 %
Hematocrit: 47 % (ref 37.5–51.0)
Hemoglobin: 15.7 g/dL (ref 13.0–17.7)
Immature Grans (Abs): 0 10*3/uL (ref 0.0–0.1)
Immature Granulocytes: 0 %
Lymphocytes Absolute: 2.4 10*3/uL (ref 0.7–3.1)
Lymphs: 27 %
MCH: 30.4 pg (ref 26.6–33.0)
MCHC: 33.4 g/dL (ref 31.5–35.7)
MCV: 91 fL (ref 79–97)
Monocytes Absolute: 0.9 10*3/uL (ref 0.1–0.9)
Monocytes: 10 %
Neutrophils Absolute: 5.4 10*3/uL (ref 1.4–7.0)
Neutrophils: 59 %
Platelets: 245 10*3/uL (ref 150–450)
RBC: 5.16 x10E6/uL (ref 4.14–5.80)
RDW: 14.1 % (ref 11.6–15.4)
WBC: 9 10*3/uL (ref 3.4–10.8)

## 2023-10-04 LAB — LIPID PANEL
Cholesterol, Total: 148 mg/dL (ref 100–199)
HDL: 33 mg/dL — ABNORMAL LOW (ref >39)
LDL CALC COMMENT:: 4.5 ratio (ref 0.0–5.0)
LDL Chol Calc (NIH): 77 mg/dL (ref 0–99)
Triglycerides: 226 mg/dL — ABNORMAL HIGH (ref 0–149)
VLDL Cholesterol Cal: 38 mg/dL (ref 5–40)

## 2023-10-04 LAB — CMP14+EGFR
ALT: 21 IU/L (ref 0–44)
AST: 26 IU/L (ref 0–40)
Albumin: 4.7 g/dL (ref 3.8–4.9)
Alkaline Phosphatase: 84 IU/L (ref 44–121)
BUN/Creatinine Ratio: 17 (ref 9–20)
BUN: 15 mg/dL (ref 6–24)
Bilirubin Total: 0.4 mg/dL (ref 0.0–1.2)
CO2: 24 mmol/L (ref 20–29)
Calcium: 10.1 mg/dL (ref 8.7–10.2)
Chloride: 98 mmol/L (ref 96–106)
Creatinine, Ser: 0.86 mg/dL (ref 0.76–1.27)
Globulin, Total: 2.4 g/dL (ref 1.5–4.5)
Glucose: 117 mg/dL — ABNORMAL HIGH (ref 70–99)
Potassium: 4.2 mmol/L (ref 3.5–5.2)
Sodium: 139 mmol/L (ref 134–144)
Total Protein: 7.1 g/dL (ref 6.0–8.5)
eGFR: 101 mL/min/{1.73_m2} (ref >59)

## 2023-10-04 LAB — PSA, TOTAL AND FREE
PSA, Free Pct: 34 %
PSA, Free: 0.17 ng/mL
Prostate Specific Ag, Serum: 0.5 ng/mL (ref 0.0–4.0)

## 2023-12-25 ENCOUNTER — Other Ambulatory Visit: Payer: Self-pay | Admitting: Nurse Practitioner

## 2023-12-25 DIAGNOSIS — I152 Hypertension secondary to endocrine disorders: Secondary | ICD-10-CM

## 2024-04-08 ENCOUNTER — Ambulatory Visit (INDEPENDENT_AMBULATORY_CARE_PROVIDER_SITE_OTHER)

## 2024-04-08 ENCOUNTER — Ambulatory Visit: Payer: Managed Care, Other (non HMO) | Admitting: Nurse Practitioner

## 2024-04-08 ENCOUNTER — Encounter: Payer: Self-pay | Admitting: Nurse Practitioner

## 2024-04-08 VITALS — BP 136/76 | HR 51 | Temp 98.1°F | Ht 73.0 in | Wt 250.0 lb

## 2024-04-08 DIAGNOSIS — E785 Hyperlipidemia, unspecified: Secondary | ICD-10-CM

## 2024-04-08 DIAGNOSIS — Z6832 Body mass index (BMI) 32.0-32.9, adult: Secondary | ICD-10-CM

## 2024-04-08 DIAGNOSIS — E1169 Type 2 diabetes mellitus with other specified complication: Secondary | ICD-10-CM

## 2024-04-08 DIAGNOSIS — M7989 Other specified soft tissue disorders: Secondary | ICD-10-CM

## 2024-04-08 DIAGNOSIS — E1159 Type 2 diabetes mellitus with other circulatory complications: Secondary | ICD-10-CM | POA: Diagnosis not present

## 2024-04-08 DIAGNOSIS — E119 Type 2 diabetes mellitus without complications: Secondary | ICD-10-CM

## 2024-04-08 DIAGNOSIS — I152 Hypertension secondary to endocrine disorders: Secondary | ICD-10-CM

## 2024-04-08 LAB — BAYER DCA HB A1C WAIVED: HB A1C (BAYER DCA - WAIVED): 6.9 % — ABNORMAL HIGH (ref 4.8–5.6)

## 2024-04-08 MED ORDER — LISINOPRIL-HYDROCHLOROTHIAZIDE 20-25 MG PO TABS: 1.0000 | ORAL_TABLET | Freq: Every day | ORAL | 1 refills | Status: DC

## 2024-04-08 MED ORDER — SIMVASTATIN 40 MG PO TABS: ORAL_TABLET | ORAL | 1 refills | Status: DC

## 2024-04-08 MED ORDER — INDOMETHACIN ER 75 MG PO CPCR: 75.0000 mg | ORAL_CAPSULE | Freq: Every day | ORAL | 0 refills | Status: DC

## 2024-04-08 NOTE — Patient Instructions (Addendum)
 Gout  Gout is painful swelling of your joints. Gout is a type of arthritis. It is caused by having too much uric acid in your body. Uric acid is a chemical that is made when your body breaks down substances called purines. If your body has too much uric acid, sharp crystals can form and build up in your joints. This causes pain and swelling. Gout attacks can happen quickly and be very painful (acute gout). Over time, the attacks can affect more joints and happen more often (chronic gout). What are the causes? Gout is caused by too much uric acid in your blood. This can happen because: Your kidneys do not remove enough uric acid from your blood. Your body makes too much uric acid. You eat too many foods that are high in purines. These foods include organ meats, some seafood, and beer. Trauma or stress can bring on an attack. What increases the risk? Having a family history of gout. Being male and middle-aged. Being male and having gone through menopause. Having an organ transplant. Taking certain medicines. Having certain conditions, such as: Being very overweight (obese). Lead poisoning. Kidney disease. A skin condition called psoriasis. Other risks include: Losing weight too quickly. Not having enough water in the body (being dehydrated). Drinking alcohol, especially beer. Drinking beverages that are sweetened with a type of sugar called fructose. What are the signs or symptoms? An attack of acute gout often starts at night and usually happens in just one joint. The most common place is the big toe. Other joints that may be affected include joints of the feet, ankle, knee, fingers, wrist, or elbow. Symptoms may include: Very bad pain. Warmth. Swelling. Stiffness. Tenderness. The affected joint may be very painful to touch. Shiny, red, or purple skin. Chills and fever. Chronic gout may cause symptoms more often. More joints may be involved. You may also have white or yellow lumps  (tophi) on your hands or feet or in other areas near your joints. How is this treated? Treatment for an acute attack may include medicines for pain and swelling, such as: NSAIDs, such as ibuprofen. Steroids taken by mouth or injected into a joint. Colchicine. This can be given by mouth or through an IV tube. Treatment to prevent future attacks may include: Taking small doses of NSAIDs or colchicine daily. Using a medicine that reduces uric acid levels in your blood, such as allopurinol. Making changes to your diet. You may need to see a food expert (dietitian) about what to eat and drink to prevent gout. Follow these instructions at home: During a gout attack  If told, put ice on the painful area. To do this: Put ice in a plastic bag. Place a towel between your skin and the bag. Leave the ice on for 20 minutes, 2-3 times a day. Take off the ice if your skin turns bright red. This is very important. If you cannot feel pain, heat, or cold, you have a greater risk of damage to the area. Raise the painful joint above the level of your heart as often as you can. Rest the joint as much as possible. If the joint is in your leg, you may be given crutches. Follow instructions from your doctor about what you cannot eat or drink. Avoiding future gout attacks Eat a low-purine diet. Avoid foods and drinks such as: Liver. Kidney. Anchovies. Asparagus. Herring. Mushrooms. Mussels. Beer. Stay at a healthy weight. If you want to lose weight, talk with your doctor. Do not  lose weight too fast. Start or continue an exercise plan as told by your doctor. Eating and drinking Avoid drinks sweetened by fructose. Drink enough fluids to keep your pee (urine) pale yellow. If you drink alcohol: Limit how much you have to: 0-1 drink a day for women who are not pregnant. 0-2 drinks a day for men. Know how much alcohol is in a drink. In the [place] one drink equals one 12 oz bottle of beer (355 mL), one 5 oz  glass of wine (148 mL), or one 1 oz glass of hard liquor (44 mL). General instructions Take over-the-counter and prescription medicines only as told by your doctor. Ask your doctor if you should avoid driving or using machines while you are taking your medicine. Return to your normal activities when your doctor says that it is safe. Keep all follow-up visits. Where to find more information [employer]: www.niams.http://www.myers.net/ Contact a doctor if: You have another gout attack. You still have symptoms of a gout attack after 10 days of treatment. You have problems (side effects) because of your medicines. You have chills or a fever. You have burning pain when you pee (urinate). You have pain in your lower back or belly. Get help right away if: You have very bad pain. Your pain cannot be controlled. You cannot pee. Summary Gout is painful swelling of the joints. The most common site of pain is the big toe, but it can affect other joints. Medicines and avoiding some foods can help to prevent and treat gout attacks. This information is not intended to replace advice given to you by your health care provider. Make sure you discuss any questions you have with your health care provider. Document Revised: 07/28/2021 Document Reviewed: 07/28/2021 Elsevier Patient Education  2024 [employer].

## 2024-04-08 NOTE — Progress Notes (Addendum)
 Subjective:    Patient ID: Austin Smith, male    DOB: Apr 18, 1966, 58 y.o.   MRN: [id number]   Chief Complaint: medical management of chronic issues     HPI:  Austin Smith is a 58 y.o. who identifies as a male who was assigned male at birth.   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 Hypertension associated with type 2 diabetes mellitus ([name]) No c/o chest pain, sob or headache. Does not check blood pressure at home BP Readings from Last 3 Encounters:  10/03/23 (!) 153/82  02/20/23 (!) 143/80  08/22/22 (!) 142/78     2. Hyperlipidemia associated with type 2 diabetes mellitus ([name]) Does not watch his diet and does very little exercise. Lab Results  Component Value Date   CHOL 148 10/03/2023   HDL 33 (L) 10/03/2023   LDLCALC 77 10/03/2023   TRIG 226 (H) 10/03/2023   CHOLHDL 4.5 10/03/2023   The 10-year ASCVD risk score (Arnett DK, et al., 2019) is: 16.4%   3. Type 2 diabetes mellitus without complication, without long-term current use of insulin ([name]) Fasting blood sugars are running around 110-120. No low blood sugars. Lab Results  Component Value Date   HGBA1C 6.6 (H) 10/03/2023     4. Morbid obesity ([name]) Weight is up 3 lbs  Wt Readings from Last 3 Encounters:  04/08/24 250 lb (113.4 kg)  10/03/23 247 lb (112 kg)  02/20/23 237 lb (107.5 kg)   BMI Readings from Last 3 Encounters:  04/08/24 32.98 kg/m  10/03/23 32.59 kg/m  02/20/23 31.27 kg/m      New complaints: Right middle finger is swollen. Started about 2 weeks ago. Thinks it is gout. Very sore to touch. He say she jammed it though 2 weeks ago and is very swollen and painful to bend.  No Known Allergies Outpatient Encounter Medications as of 04/08/2024  Medication Sig   aspirin EC 81 MG tablet Take 81 mg by mouth daily. Swallow whole.   lisinopril -hydrochlorothiazide  (ZESTORETIC ) 20-25 MG tablet  TAKE ONE (1) TABLET BY MOUTH EVERY DAY   simvastatin  (ZOCOR ) 40 MG tablet TAKE ONE (1) TABLET EACH DAY   No facility-administered encounter medications on file as of 04/08/2024.    No past surgical history on file.  Family History  Problem Relation Age of Onset   Heart disease Mother        atrial fibrillation   Cancer Paternal Aunt    Cancer Maternal Grandmother        breast   Early death Paternal Grandmother 23       myocardial in   Alzheimer's disease Paternal Grandfather    Kidney Stones Brother    Thyroid disease Brother        produce too much calcium which cause kidney stones      Controlled substance contract: n/a     Review of Systems  Constitutional:  Negative for diaphoresis.  Eyes:  Negative for pain.  Respiratory:  Negative for shortness of breath.   Cardiovascular:  Negative for chest pain, palpitations and leg swelling.  Gastrointestinal:  Negative for abdominal pain.  Endocrine: Negative for polydipsia.  Skin:  Negative for rash.  Neurological:  Negative for dizziness, weakness and headaches.  Hematological:  Does not bruise/bleed easily.  All other systems reviewed and are negative.      Objective:   Physical Exam Vitals and nursing note  reviewed.  Constitutional:      Appearance: Normal appearance. He is well-developed.  HENT:     Head: Normocephalic.     Right Ear: There is impacted cerumen.     Left Ear: There is impacted cerumen.     Nose: Nose normal.     Mouth/Throat:     Mouth: Mucous membranes are moist.     Pharynx: Oropharynx is clear.  Eyes:     Pupils: Pupils are equal, round, and reactive to light.  Neck:     Thyroid: No thyroid mass or thyromegaly.     Vascular: No carotid bruit or JVD.     Trachea: Phonation normal.  Cardiovascular:     Rate and Rhythm: Normal rate and regular rhythm.  Pulmonary:     Effort: Pulmonary effort is normal. No respiratory distress.     Breath sounds: Normal breath sounds.  Abdominal:      General: Bowel sounds are normal.     Palpations: Abdomen is soft.     Tenderness: There is no abdominal tenderness.  Musculoskeletal:        General: Normal range of motion.     Cervical back: Normal range of motion and neck supple.  Lymphadenopathy:     Cervical: No cervical adenopathy.  Skin:    General: Skin is warm and dry.  Neurological:     Mental Status: He is alert and oriented to person, place, and time.  Psychiatric:        Behavior: Behavior normal.        Thought Content: Thought content normal.        Judgment: Judgment normal.     BP 136/76   Pulse (!) 51   Temp 98.1 F (36.7 C) (Temporal)   Ht 6\' 1"  (1.854 m)   Wt 250 lb (113.4 kg)   SpO2 98%   BMI 32.98 kg/m    Hgba1c 6.9%     Assessment & Plan:   Austin Smith comes in today with chief complaint of No chief complaint on file.   Diagnosis and orders addressed:  1. Hypertension associated with type 2 diabetes mellitus ([name]) Low sodium diet - CBC with Differential/Platelet - CMP14+EGFR - aspirin EC 81 MG tablet; Take 81 mg by mouth daily. Swallow whole. - lisinopril -hydrochlorothiazide  (ZESTORETIC ) 20-25 MG tablet; Take 1 tablet by mouth daily.  Dispense: 90 tablet; Refill: 1  2. Hyperlipidemia associated with type 2 diabetes mellitus ([name]) Low fat diet - Lipid panel - simvastatin  (ZOCOR ) 40 MG tablet; TAKE ONE (1) TABLET EACH DAY  Dispense: 90 tablet; Refill: 1  3. Type 2 diabetes mellitus without complication, without long-term current use of insulin ([name]) Continue to watch carbs in diet - Bayer DCA Hb A1c Waived  4. Morbid obesity ([name]) Discussed diet and exercise for person with BMI >25 Will recheck weight in 3-6 months  5. Swollen right middle finger Indocin 75mg  1 po BID with food #60 no refills Labs pending Health Maintenance reviewed Diet and exercise encouraged  Follow up plan: 6 months   Mary-Margaret Gaylyn Keas, FNP

## 2024-04-09 ENCOUNTER — Ambulatory Visit: Payer: Self-pay | Admitting: Nurse Practitioner

## 2024-04-09 LAB — CMP14+EGFR
ALT: 17 IU/L (ref 0–44)
AST: 22 IU/L (ref 0–40)
Albumin: 4.8 g/dL (ref 3.8–4.9)
Alkaline Phosphatase: 88 IU/L (ref 44–121)
BUN/Creatinine Ratio: 21 — ABNORMAL HIGH (ref 9–20)
BUN: 15 mg/dL (ref 6–24)
Bilirubin Total: 0.3 mg/dL (ref 0.0–1.2)
CO2: 21 mmol/L (ref 20–29)
Calcium: 9.3 mg/dL (ref 8.7–10.2)
Chloride: 99 mmol/L (ref 96–106)
Creatinine, Ser: 0.71 mg/dL — ABNORMAL LOW (ref 0.76–1.27)
Globulin, Total: 2.2 g/dL (ref 1.5–4.5)
Glucose: 123 mg/dL — ABNORMAL HIGH (ref 70–99)
Potassium: 4.6 mmol/L (ref 3.5–5.2)
Sodium: 137 mmol/L (ref 134–144)
Total Protein: 7 g/dL (ref 6.0–8.5)
eGFR: 107 mL/min/{1.73_m2} (ref >59)

## 2024-04-09 LAB — CBC WITH DIFFERENTIAL/PLATELET
Basophils Absolute: 0.1 10*3/uL (ref 0.0–0.2)
Basos: 1 %
EOS (ABSOLUTE): 0.3 10*3/uL (ref 0.0–0.4)
Eos: 4 %
Hematocrit: 45 % (ref 37.5–51.0)
Hemoglobin: 14.9 g/dL (ref 13.0–17.7)
Immature Grans (Abs): 0 10*3/uL (ref 0.0–0.1)
Immature Granulocytes: 0 %
Lymphocytes Absolute: 2.5 10*3/uL (ref 0.7–3.1)
Lymphs: 31 %
MCH: 30.8 pg (ref 26.6–33.0)
MCHC: 33.1 g/dL (ref 31.5–35.7)
MCV: 93 fL (ref 79–97)
Monocytes Absolute: 0.9 10*3/uL (ref 0.1–0.9)
Monocytes: 11 %
Neutrophils Absolute: 4.1 10*3/uL (ref 1.4–7.0)
Neutrophils: 53 %
Platelets: 318 10*3/uL (ref 150–450)
RBC: 4.84 x10E6/uL (ref 4.14–5.80)
RDW: 13.3 % (ref 11.6–15.4)
WBC: 7.9 10*3/uL (ref 3.4–10.8)

## 2024-04-09 LAB — LIPID PANEL
Chol/HDL Ratio: 4.7 ratio (ref 0.0–5.0)
Cholesterol, Total: 147 mg/dL (ref 100–199)
HDL: 31 mg/dL — ABNORMAL LOW (ref >39)
LDL Chol Calc (NIH): 74 mg/dL (ref 0–99)
Triglycerides: 254 mg/dL — ABNORMAL HIGH (ref 0–149)
VLDL Cholesterol Cal: 42 mg/dL — ABNORMAL HIGH (ref 5–40)

## 2024-04-09 LAB — MICROALBUMIN / CREATININE URINE RATIO
Creatinine, Urine: 97 mg/dL
Microalb/Creat Ratio: 5 mg/g{creat} (ref 0–29)
Microalbumin, Urine: 4.5 ug/mL

## 2024-04-11 LAB — URIC ACID: Uric Acid: 8.6 mg/dL — ABNORMAL HIGH (ref 3.8–8.4)

## 2024-04-11 LAB — SPECIMEN STATUS REPORT

## 2024-06-24 ENCOUNTER — Ambulatory Visit: Admitting: Nurse Practitioner

## 2024-06-24 ENCOUNTER — Encounter: Payer: Self-pay | Admitting: Nurse Practitioner

## 2024-06-24 VITALS — BP 121/77 | HR 58 | Temp 97.4°F | Ht 73.0 in | Wt 253.0 lb

## 2024-06-24 DIAGNOSIS — J40 Bronchitis, not specified as acute or chronic: Secondary | ICD-10-CM

## 2024-06-24 MED ORDER — HYDROCODONE BIT-HOMATROP MBR 5-1.5 MG/5ML PO SOLN: 5.0000 mL | Freq: Four times a day (QID) | ORAL | 0 refills | Status: DC | PRN

## 2024-06-24 MED ORDER — PREDNISONE 20 MG PO TABS: 40.0000 mg | ORAL_TABLET | Freq: Every day | ORAL | 0 refills | Status: AC

## 2024-06-24 NOTE — Patient Instructions (Signed)
 1. Take meds as prescribed 2. Use a cool mist humidifier especially during the winter months and when heat has been humid. 3. Use saline nose sprays frequently 4. Saline irrigations of the nose can be very helpful if done frequently.  * 4X daily for 1 week*  * Use of a nettie pot can be helpful with this. Follow directions with this* 5. Drink plenty of fluids 6. Keep thermostat turn down low 7.For any cough or congestion- sedation precautions with hycodan 8. For fever or aces or pains- take tylenol or ibuprofen appropriate for age and weight.  * for fevers greater than 101 orally you may alternate ibuprofen and tylenol every  3 hours.

## 2024-06-24 NOTE — Progress Notes (Signed)
 Subjective:    Patient ID: Austin Smith, male    DOB: 10-18-1966, 58 y.o.   MRN: [id number]   Chief Complaint: URI   URI  This is a new problem. The current episode started in the past 7 days. The problem has been waxing and waning. There has been no fever. Associated symptoms include coughing and rhinorrhea. Pertinent negatives include no chest pain, congestion, sneezing or sore throat. He has tried nothing for the symptoms. The treatment provided mild relief.    Patient Active Problem List   Diagnosis Date Noted   Hypertension associated with type 2 diabetes mellitus ([name]) 07/22/2019   Type 2 diabetes mellitus without complication, without long-term current use of insulin ([name]) 07/09/2019   Hyperlipidemia associated with type 2 diabetes mellitus ([name]) 10/03/2018   Morbid obesity ([name]) 07/19/2018       Review of Systems  Constitutional:  Negative for chills and fever.  HENT:  Positive for rhinorrhea. Negative for congestion, sneezing and sore throat.   Respiratory:  Positive for cough.   Cardiovascular:  Negative for chest pain.       Objective:   Physical Exam Constitutional:      Appearance: Normal appearance. He is obese.  HENT:     Right Ear: Tympanic membrane normal.     Left Ear: Tympanic membrane normal.     Nose: No congestion or rhinorrhea.     Mouth/Throat:     Mouth: Mucous membranes are moist.  Eyes:     Extraocular Movements: Extraocular movements intact.     Pupils: Pupils are equal, round, and reactive to light.  Cardiovascular:     Rate and Rhythm: Normal rate and regular rhythm.     Heart sounds: Normal heart sounds.  Pulmonary:     Effort: Pulmonary effort is normal.     Comments: Course breath sounds Neurological:     General: No focal deficit present.     Mental Status: He is alert.    BP 121/77   Pulse (!) 58   Temp (!) 97.4 F (36.3 C)   Ht 6' 1 (1.854 m)   Wt 253 lb (114.8 kg)   SpO2 95%   BMI 33.38 kg/m          Assessment & Plan:  Austin Smith in today with chief complaint of URI   1. Bronchitis (Primary) 1. Take meds as prescribed 2. Use a cool mist humidifier especially during the winter months and when heat has been humid. 3. Use saline nose sprays frequently 4. Saline irrigations of the nose can be very helpful if done frequently.  * 4X daily for 1 week*  * Use of a nettie pot can be helpful with this. Follow directions with this* 5. Drink plenty of fluids 6. Keep thermostat turn down low 7.For any cough or congestion- hycodan as prescribed- sedation precautions 8. For fever or aces or pains- take tylenol or ibuprofen appropriate for age and weight.  * for fevers greater than 101 orally you may alternate ibuprofen and tylenol every  3 hours.    - predniSONE  (DELTASONE ) 20 MG tablet; Take 2 tablets (40 mg total) by mouth daily with breakfast for 5 days. 2 po daily for 5 days  Dispense: 10 tablet; Refill: 0 - HYDROcodone  bit-homatropine (HYCODAN) 5-1.5 MG/5ML syrup; Take 5 mLs by mouth every 6 (six) hours as needed for cough.  Dispense: 120 mL; Refill: 0    The above assessment and management plan was discussed with the patient. The  patient verbalized understanding of and has agreed to the management plan. Patient is aware to call the clinic if symptoms persist or worsen. Patient is aware when to return to the clinic for a follow-up visit. Patient educated on when it is appropriate to go to the emergency department.   [name], FNP '

## 2024-07-10 ENCOUNTER — Other Ambulatory Visit: Payer: Self-pay

## 2024-07-10 ENCOUNTER — Ambulatory Visit: Payer: Self-pay

## 2024-07-10 ENCOUNTER — Encounter (HOSPITAL_COMMUNITY): Payer: Self-pay | Admitting: Emergency Medicine

## 2024-07-10 ENCOUNTER — Emergency Department (HOSPITAL_COMMUNITY)
Admission: EM | Admit: 2024-07-10 | Discharge: 2024-07-11 | Disposition: A | Discharge status: Other Acute Inpatient Hospital | Source: Home / Self Care | Attending: Emergency Medicine | Admitting: Emergency Medicine

## 2024-07-10 DIAGNOSIS — F29 Unspecified psychosis not due to a substance or known physiological condition: Secondary | ICD-10-CM | POA: Insufficient documentation

## 2024-07-10 DIAGNOSIS — Z79899 Other long term (current) drug therapy: Secondary | ICD-10-CM | POA: Insufficient documentation

## 2024-07-10 DIAGNOSIS — Z7982 Long term (current) use of aspirin: Secondary | ICD-10-CM | POA: Insufficient documentation

## 2024-07-10 DIAGNOSIS — F309 Manic episode, unspecified: Secondary | ICD-10-CM | POA: Insufficient documentation

## 2024-07-10 DIAGNOSIS — R4689 Other symptoms and signs involving appearance and behavior: Secondary | ICD-10-CM

## 2024-07-10 DIAGNOSIS — F312 Bipolar disorder, current episode manic severe with psychotic features: Secondary | ICD-10-CM | POA: Diagnosis not present

## 2024-07-10 HISTORY — DX: Essential (primary) hypertension: I10

## 2024-07-10 HISTORY — DX: Prediabetes: R73.03

## 2024-07-10 LAB — COMPREHENSIVE METABOLIC PANEL WITH GFR
ALT: 23 U/L (ref 0–44)
AST: 31 U/L (ref 15–41)
Albumin: 4.3 g/dL (ref 3.5–5.0)
Alkaline Phosphatase: 58 U/L (ref 38–126)
Anion gap: 14 (ref 5–15)
BUN: 24 mg/dL — ABNORMAL HIGH (ref 6–20)
CO2: 22 mmol/L (ref 22–32)
Calcium: 9.3 mg/dL (ref 8.9–10.3)
Chloride: 97 mmol/L — ABNORMAL LOW (ref 98–111)
Creatinine, Ser: 1.05 mg/dL (ref 0.61–1.24)
GFR, Estimated: >60 mL/min (ref >60)
Glucose, Bld: 98 mg/dL (ref 70–99)
Potassium: 3.6 mmol/L (ref 3.5–5.1)
Sodium: 133 mmol/L — ABNORMAL LOW (ref 135–145)
Total Bilirubin: 1 mg/dL (ref 0.0–1.2)
Total Protein: 7.4 g/dL (ref 6.5–8.1)

## 2024-07-10 LAB — CBG MONITORING, ED: Glucose-Capillary: 102 mg/dL — ABNORMAL HIGH (ref 70–99)

## 2024-07-10 LAB — CBC
HCT: 42.6 % (ref 39.0–52.0)
Hemoglobin: 14.7 g/dL (ref 13.0–17.0)
MCH: 31.1 pg (ref 26.0–34.0)
MCHC: 34.5 g/dL (ref 30.0–36.0)
MCV: 90.3 fL (ref 80.0–100.0)
Platelets: 264 K/uL (ref 150–400)
RBC: 4.72 MIL/uL (ref 4.22–5.81)
RDW: 13.6 % (ref 11.5–15.5)
WBC: 14.3 K/uL — ABNORMAL HIGH (ref 4.0–10.5)
nRBC: 0 % (ref 0.0–0.2)

## 2024-07-10 LAB — ETHANOL: Alcohol, Ethyl (B): <15 mg/dL (ref <15)

## 2024-07-10 LAB — RAPID URINE DRUG SCREEN, HOSP PERFORMED
Amphetamines: NOT DETECTED
Barbiturates: NOT DETECTED
Benzodiazepines: NOT DETECTED
Cocaine: NOT DETECTED
Opiates: NOT DETECTED
Tetrahydrocannabinol: NOT DETECTED

## 2024-07-10 MED ORDER — IBUPROFEN 800 MG PO TABS: 800.0000 mg | ORAL_TABLET | Freq: Three times a day (TID) | ORAL | Status: DC | PRN

## 2024-07-10 MED ORDER — SIMVASTATIN 10 MG PO TABS: 40.0000 mg | ORAL_TABLET | Freq: Every day | ORAL | Status: DC

## 2024-07-10 MED ORDER — LISINOPRIL-HYDROCHLOROTHIAZIDE 20-25 MG PO TABS: 1.0000 | ORAL_TABLET | Freq: Every day | ORAL | Status: DC

## 2024-07-10 MED ORDER — HYDROCHLOROTHIAZIDE 25 MG PO TABS: 25.0000 mg | ORAL_TABLET | Freq: Every day | ORAL | Status: DC

## 2024-07-10 MED ORDER — LISINOPRIL 10 MG PO TABS: 20.0000 mg | ORAL_TABLET | Freq: Every day | ORAL | Status: DC

## 2024-07-10 NOTE — ED Notes (Signed)
 Wife was concerned about Pts blood sugar after pt told her that he felt like he was going to be sick. This nurse checked blood sugar which was 102. Coke and [place] sandwich given to pt. Pt expressed wanting to leave this nurse expressed that we are waiting for the provider to see him. Pt verbalized understanding.

## 2024-07-10 NOTE — Telephone Encounter (Signed)
 Yes I agree patient needs to go to the ED

## 2024-07-10 NOTE — ED Notes (Signed)
 Room taped down and cords removed from room. Pt dressed out in purple scrubs and belongings placed behind nurses station with pt label. Pt allowed to keep his bible. Family member sitting at bedside.

## 2024-07-10 NOTE — ED Notes (Signed)
 Pt asked sitter when he can leave. This nurse came in to explain we are still waiting on the other Drs to come talk to him. Pt is frustrated that is taking this long and is worried about other pts in the hallway. This nurse told him not to worry about them and that there are people concerned about him which is why he is here. Pt stated that Theres nothing wrong with me yall already told me I guess I have no rights anymore. The pts wife discreetly explained he is altered. Pt got agitated during conversation and explanation of the process here. Pt stated I have a lawyer yall dont want me to call him and bring him down here.

## 2024-07-10 NOTE — ED Provider Notes (Signed)
 [place] EMERGENCY DEPARTMENT AT [facility] Provider Note   CSN: [id number] Arrival date & time: 07/10/24  8190     Patient presents with: Psychiatric Evaluation   Austin Smith is a 58 y.o. male present emergency department with complaint of behavioral change.  Supplemental history is provided by the patient's wife at bedside.  The patient's wife reports concerns among the wife and several family members including the patient's pastor that he has had erratic behavior for the past 2 weeks.  She said he has been talking very pressured, unfocused.  He reports he had been seeing dead people.  He has been having conversations aloud with Jesus and has some religious fixations.  The patient himself admits he has not been sleeping much over the past 3 months.  He denies to me racing thoughts.  He says he is simply feeling more religious and wanting to get closer to God, does not feel that he is ill or needing additional attention.  He said he came here to humor his family.  In private his wife tells me that the patient had reported to her brother-in-law that he thought of killing himself yesterday.  The patient denies to me any suicidal or homicidal ideations.  His wife tells me the patient is keeping a notebook that she gave him filled with erratic text and ideas, which is a new habit of his beginning 2 weeks ago.  The patient is a IT trainer by profession.  His wife denies that the patient has any known prior history of mental health concerns, or any hospitalizations for mental health   HPI     Prior to Admission medications   Medication Sig Start Date End Date Taking? Authorizing Provider  aspirin  EC 81 MG tablet Take 81 mg by mouth daily. Swallow whole.   Yes [provider]  Aspirin -Acetaminophen -Caffeine (GOODY HEADACHE PO) Take 1 Package by mouth as needed (headache).   Yes [provider]  HYDROcodone  bit-homatropine (HYCODAN) 5-1.5 MG/5ML syrup Take 5 mLs by  mouth every 6 (six) hours as needed for cough. 06/24/24  Yes Martin, Mary-Margaret, FNP  ibuprofen  (ADVIL ) 200 MG tablet Take 800 mg by mouth every 6 (six) hours as needed for mild pain (pain score 1-3).   Yes [provider]  lisinopril -hydrochlorothiazide  (ZESTORETIC ) 20-25 MG tablet Take 1 tablet by mouth daily. 04/08/24  Yes Gladis, Mary-Margaret, FNP  simvastatin  (ZOCOR ) 40 MG tablet TAKE ONE (1) TABLET EACH DAY 04/08/24  Yes Gladis Mary-Margaret, FNP    Allergies: Patient has no known allergies.    Review of Systems  Updated Vital Signs BP 123/63 (BP Location: Right Arm)   Pulse 72   Temp 97.9 F (36.6 C) (Axillary)   Resp 20   Ht 6' 1 (1.854 m)   Wt 110.7 kg   SpO2 95%   BMI 32.19 kg/m   Physical Exam Constitutional:      General: He is not in acute distress.    Comments: Clutching notebook  HENT:     Head: Normocephalic and atraumatic.  Eyes:     Conjunctiva/sclera: Conjunctivae normal.     Pupils: Pupils are equal, round, and reactive to light.  Cardiovascular:     Rate and Rhythm: Normal rate and regular rhythm.  Pulmonary:     Effort: Pulmonary effort is normal. No respiratory distress.  Abdominal:     General: There is no distension.     Tenderness: There is no abdominal tenderness.  Skin:    General: Skin  is warm and dry.  Neurological:     General: No focal deficit present.     Mental Status: He is alert and oriented to person, place, and time. Mental status is at baseline.     (all labs ordered are listed, but only abnormal results are displayed) Labs Reviewed  COMPREHENSIVE METABOLIC PANEL WITH GFR - Abnormal; Notable for the following components:      Result Value   Sodium 133 (*)    Chloride 97 (*)    BUN 24 (*)    All other components within normal limits  CBC - Abnormal; Notable for the following components:   WBC 14.3 (*)    All other components within normal limits  ETHANOL  RAPID URINE DRUG SCREEN, HOSP PERFORMED     EKG: None  Radiology: No results found.   Procedures   Medications Ordered in the ED - No data to display                                  Medical Decision Making Amount and/or Complexity of Data Reviewed Labs: ordered.   Patient is here for sudden behavioral changes ongoing for about 2 weeks, sleeplessness, auditory and visual hallucinations.  This is concern for behavioral health or mental health crisis.  Patient is not responding to internal stimuli on exam.  The speech is mildly pressured, he does perseverate someone religion.  He reports he is here voluntarily but is very upset at the idea of being kept involuntarily, and is adamant that he would not want inpatient psychiatric hospitalization.  Supplemental history was provided by his wife in private.  She reports several family members and friends who have all noted the same erratic behavioral changes.  Based on this history, I have moved to invoke an involuntary commitment as the patient would not be cooperative with staying for necessary treatment, and his behavior and history is suggestive of mental health crisis or possible psychosis.  I did review his medical workup today which is unremarkable.  He is medically cleared for psychiatric evaluation     Final diagnoses:  Behavioral change    ED Discharge Orders     None          Rorik Vespa, Donnice PARAS, MD 07/10/24 2205

## 2024-07-10 NOTE — ED Notes (Signed)
 IVC paperwork is being scanned into chart now

## 2024-07-10 NOTE — Telephone Encounter (Signed)
 FYI Only or Action Required?: FYI only for provider.  Patient was last seen in primary care on 06/24/2024 by Gladis Mustard, FNP.  Called Nurse Triage reporting Mental Health Problem.  Symptoms began several days ago.  Interventions attempted: Nothing.  Symptoms are: rapidly worsening.  Triage Disposition: Go to ED Now (or PCP Triage)  Patient/caregiver understands and will follow disposition?: UnsureCopied from CRM #[id number]. Topic: Clinical - Red Word Triage >> Jul 10, 2024  8:41 AM Antwanette L wrote: Red Word that prompted transfer to Nurse Triage: Patient wife is calling bc the patient is hallucinating. The patient is seeing things that are not there( dead people). The patient wife did a welfare check last night and the patient told the  police officer he fine. The patient was praising God and removing demons. Reason for Disposition  Patient sounds very sick or weak to the triager  Answer Assessment - Initial Assessment Questions Wife Macario called with concerns of pt hallucination. 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 Macario had police do welfare check last night. Macario was told he's praising God and casting out demons. Pt called Macario this morning at 4am and hasn't slept all night. Macario stated this started last week but got worse yesterday. He's been really stressed with job and quit but went back. Since then he's out of it.. Pt feels he is being watched.  RN advised Macario to call back when she knows when is coming home to make appt or if symptoms worsen to call 911. Pt is currently walking around [employer]. Wife is getting employer to get involved and get help to get him home. Wife has removed guns from home.  Protocols used: Confusion - Delirium-A-AH

## 2024-07-10 NOTE — Telephone Encounter (Signed)
 Spoke with wife and she states she is going to take him to ED today.

## 2024-07-10 NOTE — ED Triage Notes (Signed)
 Pt present with pastor and spouse with c/o of Altered behavior that started 2 weeks ago. Pt is hyper religious and states they are a new man and is aware they are acting different but it is d/t a new spiritual experience. 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 Family were concerned of pt's behavior and called [place]  police to check on pt. Pt arrived in town and upon their arrival at their job, family and ems were waiting on pt. Pt admits to seeing past deceased relatives but they are not talking to him. Pt admits to having conversations with Jesus. Pt has flight of ideas with difficulties focusing on one topic at a time. Pt's spouse and pt feel that pt is not sleeping enough which may be aiding in pt's anxiety and some of the recent behavior changes. Pt's spouse states that pt's peers at work and pastor have noticed pt's behavior changes and are concerned.

## 2024-07-11 ENCOUNTER — Encounter (HOSPITAL_COMMUNITY): Payer: Self-pay | Admitting: Psychiatry

## 2024-07-11 ENCOUNTER — Inpatient Hospital Stay (HOSPITAL_COMMUNITY)
Admission: AD | Admit: 2024-07-11 | Discharge: 2024-07-14 | DRG: 885 | Disposition: A | Discharge status: Other Acute Inpatient Hospital | Source: Intra-hospital

## 2024-07-11 DIAGNOSIS — Z7982 Long term (current) use of aspirin: Secondary | ICD-10-CM

## 2024-07-11 DIAGNOSIS — Z8249 Family history of ischemic heart disease and other diseases of the circulatory system: Secondary | ICD-10-CM | POA: Diagnosis not present

## 2024-07-11 DIAGNOSIS — I10 Essential (primary) hypertension: Secondary | ICD-10-CM | POA: Diagnosis present

## 2024-07-11 DIAGNOSIS — E785 Hyperlipidemia, unspecified: Secondary | ICD-10-CM | POA: Diagnosis present

## 2024-07-11 DIAGNOSIS — E860 Dehydration: Secondary | ICD-10-CM | POA: Diagnosis present

## 2024-07-11 DIAGNOSIS — D72829 Elevated white blood cell count, unspecified: Secondary | ICD-10-CM | POA: Diagnosis present

## 2024-07-11 DIAGNOSIS — E119 Type 2 diabetes mellitus without complications: Secondary | ICD-10-CM | POA: Diagnosis present

## 2024-07-11 DIAGNOSIS — Z79899 Other long term (current) drug therapy: Secondary | ICD-10-CM | POA: Diagnosis not present

## 2024-07-11 DIAGNOSIS — E66811 Obesity, class 1: Secondary | ICD-10-CM | POA: Diagnosis not present

## 2024-07-11 DIAGNOSIS — I959 Hypotension, unspecified: Secondary | ICD-10-CM

## 2024-07-11 DIAGNOSIS — N179 Acute kidney failure, unspecified: Secondary | ICD-10-CM | POA: Diagnosis present

## 2024-07-11 DIAGNOSIS — Z781 Physical restraint status: Secondary | ICD-10-CM | POA: Diagnosis not present

## 2024-07-11 DIAGNOSIS — F309 Manic episode, unspecified: Principal | ICD-10-CM | POA: Diagnosis present

## 2024-07-11 DIAGNOSIS — Z6831 Body mass index (BMI) 31.0-31.9, adult: Secondary | ICD-10-CM | POA: Diagnosis not present

## 2024-07-11 DIAGNOSIS — F312 Bipolar disorder, current episode manic severe with psychotic features: Secondary | ICD-10-CM | POA: Diagnosis present

## 2024-07-11 DIAGNOSIS — E1169 Type 2 diabetes mellitus with other specified complication: Secondary | ICD-10-CM | POA: Diagnosis not present

## 2024-07-11 LAB — URINALYSIS, ROUTINE W REFLEX MICROSCOPIC
Bacteria, UA: NONE SEEN
Bilirubin Urine: NEGATIVE
Glucose, UA: NEGATIVE mg/dL
Hgb urine dipstick: NEGATIVE
Ketones, ur: NEGATIVE mg/dL
Leukocytes,Ua: NEGATIVE
Nitrite: NEGATIVE
Protein, ur: NEGATIVE mg/dL
Specific Gravity, Urine: 1.019 (ref 1.005–1.030)
pH: 5 (ref 5.0–8.0)

## 2024-07-11 MED ORDER — OLANZAPINE 10 MG IM SOLR
5.0000 mg | Freq: Three times a day (TID) | INTRAMUSCULAR | Status: DC | PRN
  Filled 2024-07-11: qty 10

## 2024-07-11 MED ORDER — OLANZAPINE 5 MG PO TBDP: 5.0000 mg | ORAL_TABLET | Freq: Three times a day (TID) | ORAL | Status: DC | PRN

## 2024-07-11 NOTE — ED Provider Notes (Signed)
 Patient in no distress, sitting upright.  Patient accepted to behavioral health.   Garrick Charleston, MD 07/11/24 [phone]

## 2024-07-11 NOTE — Progress Notes (Addendum)
 Patient is a 58 year old male who presented under IVC from [facility] for erratic behavior, including hyper-religiosity. Per report, pt's wife had been concerned for pt's mental health due to changes in his behavior.. Pt reported that he had not slept well in the past 3 months due to job stress. 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 Pt denied SI/HI and A/VH, and didn't appear to be responding to internal stimuli. Pt denied alcohol and illicit substance use. Pt presented anxious, but was polite, answered questions logically and coherently during admission interview and assessment. 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 Pt also voiced concern that he would not be home by the weekend-stating that he and his son, who are in a band together- have a concert this Saturday night. VS monitored and recorded. Skin check MHT. Belongings (necklace and silicone ring) secured in locker. Admission paperwork completed and signed. Verbal understanding expressed . Patient was oriented to unit and schedule. Patient provided with meal and po fluids. Q 15 min checks initiated for safety.

## 2024-07-11 NOTE — Plan of Care (Signed)
  Problem: Education: Goal: Knowledge of [facility] Education information/materials will improve Outcome: Progressing Goal: Verbalization of understanding the information provided will improve Outcome: Progressing   Problem: Education: Goal: Verbalization of understanding the information provided will improve Outcome: Progressing

## 2024-07-11 NOTE — [facility] Group Notes (Signed)
 Adult Psychoeducational Group Note  Date:  07/11/2024 Time:  9:06 PM  Group Topic/Focus:  Wrap-Up Group:   The focus of this group is to help patients review their daily goal of treatment and discuss progress on daily workbooks.  Participation Level:  Active  Participation Quality:  Attentive  Affect:  Appropriate  Cognitive:  Alert  Insight: Appropriate  Engagement in Group:  Engaged  Modes of Intervention:  Discussion  Additional Comments:  Patient attended and participated in the Wrap-up group.  Vonne JINNY Pepper 07/11/2024, 9:06 PM

## 2024-07-11 NOTE — ED Notes (Signed)
 Pt and wife updated on plan of care. IVC papers were served to pt as well. Pt is upset and tearful stating Im just run ragged if I just get some sleep I promise I will be different tomorrow. Pt tried to convince this nurse to let him leave tomorrow after he gets some sleep. This nurse explained this is a way for him to get rest and take care of himself. Pt still does not understand why he is here and thinks he is of sound mind. This nurse turned the lights out and encouraged him to go to sleep.

## 2024-07-11 NOTE — Progress Notes (Signed)
 Pt has been accepted to [facility] on 07/11/2024 Bed assignment: 400-02  Pt meets inpatient criteria per: Gaither Pouch NP  Attending Physician will be: Dr. Prentis    Report can be called to:  unit: Adult unit: [phone]  Pt can arrive after Bed is ready now   Care Team Notified: [facility]  Bretta Qua RN, Camelia Chill RN  [place] Joanna Borawski LCSW-A   07/11/2024 9:59 AM

## 2024-07-11 NOTE — Tx Team (Signed)
 Initial Treatment Plan 07/11/2024 4:54 PM [name] FMW:[id number]    PATIENT STRESSORS: Occupational concerns   Other: insomnia     PATIENT STRENGTHS: Average or above average intelligence  Capable of independent living  Communication skills  Physical Health  Supportive family/friends  Work skills    PATIENT IDENTIFIED PROBLEMS: Insomnia    Job stress                  DISCHARGE CRITERIA:  Improved stabilization in mood, thinking, and/or behavior Need for constant or close observation no longer present Reduction of life-threatening or endangering symptoms to within safe limits  PRELIMINARY DISCHARGE PLAN: Outpatient therapy Return to previous living arrangement Return to previous work or school arrangements  PATIENT/FAMILY INVOLVEMENT: This treatment plan has been presented to and reviewed with the patient, Austin Smith, .  The patient has been given the opportunity to ask questions and make suggestions.  Berwyn GORMAN Acosta, RN 07/11/2024, 4:54 PM

## 2024-07-11 NOTE — BH Assessment (Addendum)
 Comprehensive Clinical Assessment (CCA) Note   07/11/2024 [place] [employer] [id number]  Disposition: Per Gaither Pouch, NP  patient is recommended for inpatient admission.  Disposition SW to pursue appropriate inpatient options.  The patient demonstrates the following risk factors for suicide: Chronic risk factors for suicide include:n/a Acute risk factors for suicide include: N/A and family or marital conflict. Protective factors for this patient include: positive social support. Considering these factors, the overall suicide risk at this point appears to be low. Patient is not appropriate for outpatient follow up.  Upon evaluation with this clinician, the patient is alert, oriented x 3, and cooperative. Speech is pressured, coherent and logical. Pt appears casual. Eye contact is fair. Mood is anxious; affect is congruent with mood. The thought process is logical and thought content is coherent. Pt presents hyper religious and was observed holding bible throughout assessment. Pt reports speaking in tongues for the first time last night which was filmed by his wife. Pt states , they are all panicking because I am getting right with Jesus.      Patient is a 58 year old male with a history of anxiety disorder who presents involuntarily to [facility] for an assessment. Patient resides in the home with his wife and identifies his wife and pastor as their primary support system.Patient reports racing thoughts, worry, and unable to sleep.Pt reports that he has not slept in the past two days. .Patient denies NSSIB, SI, HI, AVH. There is no indication that the patient is responding to internal stimuli. No delusions elicited during this assessment.    Patient identifies his primary stressors as lack of sleep and his job.. . Patient denies history of abuse or trauma. Patient denies current legal problems. Patient is not receiving outpatient therapy and psychiatry services. Patient reports he is not prescribed  psychotropic medications. Patient denies previous inpatient admission.  Patient denies access to weapons.   Patient is can to contract for safety outside of the hospital.  Patient gives verbal consent for [facility] to speak with wife Tulio Facundo) who was present during the assessment.   Per wife, pt is not sleeping and she is concern for pt's mental health.   Treatment options were discussed and patient is NOT in agreement with recommendation for inpatient hospitalization.        Chief Complaint:  Chief Complaint  Patient presents with   Psychiatric Evaluation   Visit Diagnosis:  Mania   CCA Screening, Triage and Referral (STR)  Patient Reported Information How did you hear about us ? -- ([facility])  What Is the Reason for Your Visit/Call Today? Per EDP's note: imothy Emanuelson is a 58 y.o. male present emergency department with complaint of behavioral change.  Supplemental history is provided by the patient's wife at bedside.  The patient's wife reports concerns among the wife and several family members including the patient's pastor that he has had erratic behavior for the past 2 weeks.  She said he has been talking very pressured, unfocused.  He reports he had been seeing dead people.  He has been having conversations aloud with Jesus and has some religious fixations.  The patient himself admits he has not been sleeping much over the past 3 months.  He denies to me racing thoughts.  He says he is simply feeling more religious and wanting to get closer to God, does not feel that he is ill or needing additional attention.  He said he came here to humor his family.  In private his wife tells me that the patient had reported to her brother-in-law that he thought of killing himself yesterday.  The patient denies to me any suicidal or homicidal ideations.     His wife tells me the patient is keeping a notebook that she gave him filled with erratic text and ideas, which is a new habit of his beginning 2  weeks ago.     The patient is a IT trainer by profession.     His wife denies that the patient has any known prior history of mental health concerns, or any hospitalizations for mental health  How Long Has This Been Causing You Problems? 1-6 months  What Do You Feel Would Help You the Most Today? Treatment for Depression or other mood problem; Stress Management; Medication(s)   Have You Recently Had Any Thoughts About Hurting Yourself? No  Are You Planning to Commit Suicide/Harm Yourself At This time? No   Flowsheet Row ED from 07/10/2024 in [facility] Emergency Department at [facility]  C-SSRS RISK CATEGORY No Risk    Have you Recently Had Thoughts About Hurting Someone [place]? No  Are You Planning to Harm Someone at This Time? No  Explanation: Pt denies HI   Have You Used Any Alcohol or Drugs in the Past 24 Hours? No  How Long Ago Did You Use Drugs or Alcohol? N/A What Did You Use and How Much? N/A  Do You Currently Have a Therapist/Psychiatrist? No  Name of Therapist/Psychiatrist:    Have You Been Recently Discharged From Any Office Practice or Programs? No  Explanation of Discharge From Practice/Program:n/a    CCA Screening Triage Referral Assessment Type of Contact: Tele-Assessment  Telemedicine Service Delivery: Telemedicine service delivery: This service was provided via telemedicine using a 2-way, interactive audio and video technology  Is this Initial or Reassessment? Is this Initial or Reassessment?: Initial Assessment  Date Telepsych consult ordered in [facility]:  Date Telepsych consult ordered in [facility]: 07/10/24  Time Telepsych consult ordered in [facility]:  Time Telepsych consult ordered in [facility]: 2128  Location of Assessment: [facility]  Provider Location: [facility] Assessment Services   Collateral Involvement: Pt's wife : Kenyan Karnes   Does Patient Have a Automotive engineer Guardian? No  Legal Guardian Contact Information: n/a  Copy of Legal Guardianship Form:  -- (n/a)  Legal Guardian Notified of Arrival: -- (n/a)  Legal Guardian Notified of Pending Discharge: -- (n/a)  If Minor and Not Living with Parent(s), Who has Custody? n/a  Is CPS involved or ever been involved? Never  Is APS involved or ever been involved? Never   Patient Determined To Be At Risk for Harm To Self or Others Based on Review of Patient Reported Information or Presenting Complaint? No  Method: No Plan  Availability of Means: No access or NA  Intent: Vague intent or NA  Notification Required: No need or identified person  Additional Information for Danger to Others Potential: -- (n/a)  Additional Comments for Danger to Others Potential: n/a  Are There Guns or Other Weapons in Your Home? No  Types of Guns/Weapons: Pt denies access to guns  Are These Weapons Safely Secured?                            No  Who Could Verify You Are Able To Have These Secured: Pt denies access  Do You Have any Outstanding Charges, Pending Court Dates, Parole/Probation? Pt denies  legal charges  Contacted To Inform of Risk of Harm To Self or Others: Other: Comment (n/a)    Does Patient Present under Involuntary Commitment? Yes    [place] of Residence: [place]   Patient Currently Receiving the Following Services: Not Receiving Services   Determination of Need: Urgent (48 hours)   Options For Referral: Inpatient Hospitalization     CCA Biopsychosocial Patient Reported Schizophrenia/Schizoaffective Diagnosis in Past: No   Strengths: Pt has a great support system.   Mental Health Symptoms Depression:  None   Duration of Depressive symptoms:    Mania:  Change in energy/activity; Increased Energy; Racing thoughts   Anxiety:   Restlessness; Worrying; Fatigue   Psychosis:  None   Duration of Psychotic symptoms:    Trauma:  None   Obsessions:  None   Compulsions:  None   Inattention:  None   Hyperactivity/Impulsivity:  None   Oppositional/Defiant  Behaviors:  None   Emotional Irregularity:  None   Other Mood/Personality Symptoms:  Hyper religious    Mental Status Exam Appearance and self-care  Stature:  Average   Weight:  Average weight   Clothing:  Casual (scrubs)   Grooming:  n/a  Cosmetic use:  None   Posture/gait:  Normal   Motor activity:  Restless   Sensorium  Attention:  Persistent   Concentration:  Anxiety interferes   Orientation:  X5   Recall/memory:  Normal   Affect and Mood  Affect:  Anxious   Mood:  Anxious; Euphoric   Relating  Eye contact:  Normal   Facial expression:  Anxious   Attitude toward examiner:  Cooperative; Sarcastic   Thought and Language  Speech flow: Flight of Ideas; Loud; Pressured   Thought content:  Appropriate to Mood and Circumstances   Preoccupation:  Religion   Hallucinations:  Visual (Pt reports seeing those who has passed away)   Organization:  Circumstantial   Company secretary of Knowledge:  Good   Intelligence:  Average   Abstraction:  Normal   Judgement:  Impaired   Reality Testing:  Distorted   Insight:  Lacking   Decision Making:  Impulsive   Social Functioning  Social Maturity:  Impulsive   Social Judgement:  Naive   Stress  Stressors:  Work (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)   Coping Ability:  Exhausted; Overwhelmed   Skill Deficits:  Communication; Interpersonal; Decision making   Supports:  Family     Religion: Religion/Spirituality Are You A Religious Person?: Yes What is Your Religious Affiliation?: Christian How Might This Affect Treatment?: n/a  Leisure/Recreation: Leisure / Recreation Do You Have Hobbies?: No  Exercise/Diet: Exercise/Diet Do You Exercise?: No Have You Gained or Lost A Significant Amount of Weight in the Past Six Months?: No Do You Follow a Special Diet?: No Do You Have Any Trouble Sleeping?: Yes Explanation of Sleeping Difficulties: Pt reports that he has not slept in 2 days   CCA  Employment/Education Employment/Work Situation: Employment / Work Situation Employment Situation: Employed Work Stressors: 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) has Been Impacted by Current Illness: No Has Patient ever Been in Equities trader?: No  Education: Education Is Patient Currently Attending School?: No Last Grade Completed: 12 Did You Product manager?: No Did You Have An Individualized Education Program (IIEP): No Did You Have Any Difficulty At [employer]?: No Patient's Education Has Been Impacted by Current Illness: No   CCA Family/Childhood History Family and Relationship History: Family history  Marital status: Married Number of Years Married:  Industrial/product designer) What types of issues is patient dealing with in the relationship?: Pt's wife is concern about pt's mental health Additional relationship information: n/a Does patient have children?:  (UTA)  Childhood History:  Childhood History By whom was/is the patient raised?: Mother Did patient suffer any verbal/emotional/physical/sexual abuse as a child?: No Did patient suffer from severe childhood neglect?: No Has patient ever been sexually abused/assaulted/raped as an adolescent or adult?: No Was the patient ever a victim of a crime or a disaster?: No Witnessed domestic violence?: No Has patient been affected by domestic violence as an adult?: No       CCA Substance Use Alcohol/Drug Use: Alcohol / Drug Use Pain Medications: SEE MAR Prescriptions: SEE MAR Over the Counter: SEE MAR History of alcohol / drug use?: No history of alcohol / drug abuse Longest period of sobriety (when/how long): Pt denies etoh and drug use Negative Consequences of Use:  (n/a) Withdrawal Symptoms:  (n/a)                         ASAM's:  Six Dimensions of Multidimensional Assessment  Dimension 1:  Acute Intoxication and/or Withdrawal Potential:   Dimension 1:  Description of individual's past and current  experiences of substance use and withdrawal: n/a  Dimension 2:  Biomedical Conditions and Complications:   Dimension 2:  Description of patient's biomedical conditions and  complications: n/a  Dimension 3:  Emotional, Behavioral, or Cognitive Conditions and Complications:  Dimension 3:  Description of emotional, behavioral, or cognitive conditions and complications: n/a  Dimension 4:  Readiness to Change:  Dimension 4:  Description of Readiness to Change criteria: n/a  Dimension 5:  Relapse, Continued use, or Continued Problem Potential:  Dimension 5:  Relapse, continued use, or continued problem potential critiera description: n/a  Dimension 6:  Recovery/Living Environment:  Dimension 6:  Recovery/Iiving environment criteria description: n/a  ASAM Severity Score:    ASAM Recommended Level of Treatment: ASAM Recommended Level of Treatment:  (n/a)   Substance use Disorder (SUD) Substance Use Disorder (SUD)  Checklist Symptoms of Substance Use:  (n/a)  Recommendations for Services/Supports/Treatments: Recommendations for Services/Supports/Treatments Recommendations For Services/Supports/Treatments: Inpatient Hospitalization  Disposition Recommendation per psychiatric provider: We recommend inpatient psychiatric hospitalization after medical hospitalization. Patient has been involuntarily committed on 07/10/24.    DSM5 Diagnoses: Patient Active Problem List   Diagnosis Date Noted   Hypertension associated with type 2 diabetes mellitus ([name]) 07/22/2019   Type 2 diabetes mellitus without complication, without long-term current use of insulin ([name]) 07/09/2019   Hyperlipidemia associated with type 2 diabetes mellitus ([name]) 10/03/2018   Morbid obesity ([name]) 07/19/2018     Referrals to Alternative Service(s): Referred to Alternative Service(s):   Place:   Date:   Time:    Referred to Alternative Service(s):   Place:   Date:   Time:    Referred to Alternative Service(s):   Place:   Date:    Time:    Referred to Alternative Service(s):   Place:   Date:   Time:     Rosina PARAS, [place], [facility]

## 2024-07-11 NOTE — ED Notes (Signed)
 Pt on with TTS.

## 2024-07-12 ENCOUNTER — Encounter (HOSPITAL_COMMUNITY): Payer: Self-pay

## 2024-07-12 MED ORDER — LISINOPRIL-HYDROCHLOROTHIAZIDE 20-25 MG PO TABS: 1.0000 | ORAL_TABLET | Freq: Every day | ORAL | Status: DC

## 2024-07-12 MED ORDER — DIPHENHYDRAMINE HCL 50 MG/ML IJ SOLN
50.0000 mg | Freq: Three times a day (TID) | INTRAMUSCULAR | Status: DC | PRN
Start: 2024-07-12 — End: 2024-07-14

## 2024-07-12 MED ORDER — SIMVASTATIN 20 MG PO TABS
40.0000 mg | ORAL_TABLET | Freq: Every day | ORAL | Status: DC
  Administered 2024-07-13: 40 mg via ORAL
  Filled 2024-07-12 (×2): qty 2

## 2024-07-12 MED ORDER — DIPHENHYDRAMINE HCL 25 MG PO CAPS
50.0000 mg | ORAL_CAPSULE | Freq: Three times a day (TID) | ORAL | Status: DC | PRN
  Administered 2024-07-13: 50 mg via ORAL
  Filled 2024-07-12: qty 2

## 2024-07-12 MED ORDER — TRAZODONE HCL 50 MG PO TABS
50.0000 mg | ORAL_TABLET | Freq: Every evening | ORAL | Status: DC | PRN
  Filled 2024-07-12: qty 1

## 2024-07-12 MED ORDER — HYDROCHLOROTHIAZIDE 25 MG PO TABS
25.0000 mg | ORAL_TABLET | Freq: Every day | ORAL | Status: DC
  Administered 2024-07-13: 25 mg via ORAL
  Filled 2024-07-12: qty 1

## 2024-07-12 MED ORDER — LISINOPRIL 10 MG PO TABS
20.0000 mg | ORAL_TABLET | Freq: Every day | ORAL | Status: DC
  Administered 2024-07-13: 20 mg via ORAL
  Filled 2024-07-12: qty 1

## 2024-07-12 MED ORDER — DIPHENHYDRAMINE HCL 50 MG/ML IJ SOLN: 50.0000 mg | Freq: Three times a day (TID) | INTRAMUSCULAR | Status: DC | PRN

## 2024-07-12 MED ORDER — LORAZEPAM 2 MG/ML IJ SOLN
INTRAMUSCULAR | Status: AC
  Administered 2024-07-12: 2 mg via INTRAMUSCULAR
  Filled 2024-07-12: qty 1

## 2024-07-12 MED ORDER — ASPIRIN 81 MG PO TBEC
81.0000 mg | DELAYED_RELEASE_TABLET | Freq: Every day | ORAL | Status: DC
Start: 2024-07-12 — End: 2024-07-14
  Administered 2024-07-13 – 2024-07-14 (×2): 81 mg via ORAL
  Filled 2024-07-12 (×2): qty 1

## 2024-07-12 MED ORDER — HALOPERIDOL 5 MG PO TABS
5.0000 mg | ORAL_TABLET | Freq: Three times a day (TID) | ORAL | Status: DC | PRN
  Administered 2024-07-13: 5 mg via ORAL
  Filled 2024-07-12: qty 1

## 2024-07-12 MED ORDER — ACETAMINOPHEN 325 MG PO TABS: 650.0000 mg | ORAL_TABLET | Freq: Four times a day (QID) | ORAL | Status: DC | PRN

## 2024-07-12 MED ORDER — LORAZEPAM 2 MG/ML IJ SOLN: 2.0000 mg | Freq: Three times a day (TID) | INTRAMUSCULAR | Status: DC | PRN

## 2024-07-12 MED ORDER — HALOPERIDOL LACTATE 5 MG/ML IJ SOLN
INTRAMUSCULAR | Status: AC
  Administered 2024-07-12: 5 mg via INTRAMUSCULAR
  Filled 2024-07-12: qty 1

## 2024-07-12 MED ORDER — RISPERIDONE 1 MG PO TABS
1.0000 mg | ORAL_TABLET | Freq: Two times a day (BID) | ORAL | Status: DC
  Administered 2024-07-13: 1 mg via ORAL
  Filled 2024-07-12 (×2): qty 1

## 2024-07-12 MED ORDER — HALOPERIDOL LACTATE 5 MG/ML IJ SOLN
5.0000 mg | Freq: Three times a day (TID) | INTRAMUSCULAR | Status: DC | PRN
Start: 2024-07-12 — End: 2024-07-14

## 2024-07-12 MED ORDER — HALOPERIDOL LACTATE 5 MG/ML IJ SOLN: 10.0000 mg | Freq: Three times a day (TID) | INTRAMUSCULAR | Status: DC | PRN

## 2024-07-12 MED ORDER — DIPHENHYDRAMINE HCL 50 MG/ML IJ SOLN
INTRAMUSCULAR | Status: AC
  Administered 2024-07-12: 50 mg via INTRAMUSCULAR
  Filled 2024-07-12: qty 1

## 2024-07-12 MED ORDER — HYDROXYZINE HCL 25 MG PO TABS
25.0000 mg | ORAL_TABLET | Freq: Three times a day (TID) | ORAL | Status: DC | PRN
  Administered 2024-07-13: 25 mg via ORAL
  Filled 2024-07-12 (×2): qty 1

## 2024-07-12 NOTE — Plan of Care (Addendum)
  Face to Face Evaluation of Restrictive Event (initial)  Date Face to Face Completed  07/12/2024  Time Face to Face Completed 7:50  Face to Face Evaluation within one hour of restrictive event   Reevaluation for continuation of restrictive intervention   Face to Face Sensorium Intact  Face to Face Orientation   Face to Face Verbal Response   Face to Face Thought Processes disorganized  Face to Face Psychomotor Response PMA  Face to Face Mood anxious  Face to Face Affect  agitated  Suggestions for helping patient regain control   Will patient contract for safety of self and others? No  Can patient remain calm? No  Can patient manage own behavior? No  Description of patient behavior during check Patient yelling/screaming about Trump and/or his mother coming to this building to beat him, confused, hyperreligious, not redirectable, yelling, grandiose  Signs of patient injury No  Description of Physiological Status WNL, no immediate defects noted  Description of Psychological Status Intrusive, preaching at other patients, disorganized thinking and speech, agitated, threatening and acutely dangerous to staff and others  Any alterations in the criteria for release? None  Evaluator's Recommendations  Patient meets criteria for Chair restraint, chemical restraint, seclusion -- Patient out of chair and sleeping soundly at 8:10 am  Face to face completed by Morene Cleveland MD

## 2024-07-12 NOTE — Group Note (Signed)
 Date:  07/12/2024 Time:  8:43 PM  Group Topic/Focus:  Wrap-Up Group:   The focus of this group is to help patients review their daily goal of treatment and discuss progress on daily workbooks.    Participation Level:  Did Not Attend   Stephanie Littman Dacosta 07/12/2024, 8:43 PM

## 2024-07-12 NOTE — Plan of Care (Signed)
   Problem: Activity: Goal: Sleeping patterns will improve Outcome: Progressing   Problem: Safety: Goal: Periods of time without injury will increase Outcome: Progressing

## 2024-07-12 NOTE — Progress Notes (Signed)
 Patient observed to be escalating on unit at around 0705. Multiple attempts at diversion and redirection unsuccessful. Patient perseverates on Trump, Jesus, and how white, black, it doesn't matter. Patient began yelling and posturing in milieu after PRN Rx were offered. IM medication offered, refused. Patient unable to return to baseline. Patient threatened to attack staff. Restraint char initiated at 0735, IM PRN Rx administered at 0745. Physician, [facility], and family made aware. Restraint chair ended at 0810, patient noted to rest in quiet room with all doors open. Patient remains asleep in quiet room at this time.

## 2024-07-12 NOTE — Group Note (Deleted)
 Recreation Therapy Group Note   Group Topic:Coping Skills  Group Date: 07/12/2024 Start Time: 1015 End Time: 1045 Facilitators: Jmya Uliano-McCall, Thayne Cindric A, NT Location: [address] Dayroom       Affect/Mood: {RT BHH Affect/Mood:26271}   Participation Level: {RT BHH Participation [employer]   Participation Quality: {RT BHH Participation Quality:26268}   Behavior: {RT BHH Group Behavior:26269}   Speech/Thought Process: {RT BHH Speech/Thought:26276}   Insight: {RT BHH Insight:26272}   Judgement: {RT BHH Judgement:26278}   Modes of Intervention: {RT BHH Modes of Intervention:26277}   Patient Response to Interventions:  {RT BHH Patient Response to Intervention:26274}   Education Outcome:  {RT BHH Education Outcome:26279}   Clinical Observations/Individualized Feedback: *** was *** in their participation of session activities and group discussion. Pt identified ***   Plan: {RT BHH Tx Plan:26280}   Cresencia Asmus A Edlin Ford-McCall, NT,  07/12/2024 12:57 PM

## 2024-07-12 NOTE — Progress Notes (Signed)
(  Sleep Hours) -10.25  (Any PRNs that were needed, meds refused, or side effects to meds)- Vistaril  25 mg , Haldol  5 mg , Benadryl  50 mg  (Any disturbances and when (visitation, over night)- Pt got up in the middle of the morning and appeared confused , pt received confusion protocol per [facility].   (Concerns raised by the patient)- N/A  (SI/HI/AVH)-N/A

## 2024-07-12 NOTE — BH IP Treatment Plan (Addendum)
 Interdisciplinary Treatment and Diagnostic Plan Update  07/12/2024 Time of Session: 10:15 AM Dillin Lofgren MRN: [id number]  Principal Diagnosis: Severe manic bipolar 1 disorder with psychotic behavior ([name])  Secondary Diagnoses: Principal Problem:   Severe manic bipolar 1 disorder with psychotic behavior ([name])   Current Medications:  Current Facility-Administered Medications  Medication Dose Route Frequency Provider Last Rate Last Admin   acetaminophen  (TYLENOL ) tablet 650 mg  650 mg Oral Q6H PRN Rollene Katz, MD       aspirin  EC tablet 81 mg  81 mg Oral Daily Rollene Katz, MD       haloperidol  (HALDOL ) tablet 5 mg  5 mg Oral TID PRN Rollene Katz, MD       And   diphenhydrAMINE  (BENADRYL ) capsule 50 mg  50 mg Oral TID PRN Rollene Katz, MD       haloperidol  lactate (HALDOL ) injection 5 mg  5 mg Intramuscular TID PRN Rollene Katz, MD   5 mg at 07/12/24 0745   And   diphenhydrAMINE  (BENADRYL ) injection 50 mg  50 mg Intramuscular TID PRN Rollene Katz, MD   50 mg at 07/12/24 0745   And   LORazepam  (ATIVAN ) injection 2 mg  2 mg Intramuscular TID PRN Rollene Katz, MD   2 mg at 07/12/24 0745   haloperidol  lactate (HALDOL ) injection 10 mg  10 mg Intramuscular TID PRN Rollene Katz, MD       And   diphenhydrAMINE  (BENADRYL ) injection 50 mg  50 mg Intramuscular TID PRN Rollene Katz, MD       And   LORazepam  (ATIVAN ) injection 2 mg  2 mg Intramuscular TID PRN Rollene Katz, MD       lisinopril  (ZESTRIL ) tablet 20 mg  20 mg Oral Daily Towana Leita SAILOR, MD       And   hydrochlorothiazide  (HYDRODIURIL ) tablet 25 mg  25 mg Oral Daily Towana Leita SAILOR, MD       hydrOXYzine  (ATARAX ) tablet 25 mg  25 mg Oral TID PRN Rollene Katz, MD       risperiDONE  (RISPERDAL ) tablet 1 mg  1 mg Oral BID Rollene Katz, MD       simvastatin  (ZOCOR ) tablet 40 mg  40 mg Oral q1800 Rollene Katz, MD       traZODone  (DESYREL ) tablet 50 mg  50 mg Oral  QHS PRN Rollene Katz, MD       PTA Medications: Medications Prior to Admission  Medication Sig Dispense Refill Last Dose/Taking   aspirin  EC 81 MG tablet Take 81 mg by mouth daily. Swallow whole.      Aspirin -Acetaminophen -Caffeine (GOODY HEADACHE PO) Take 1 Package by mouth as needed (headache).      HYDROcodone  bit-homatropine (HYCODAN) 5-1.5 MG/5ML syrup Take 5 mLs by mouth every 6 (six) hours as needed for cough. 120 mL 0    ibuprofen  (ADVIL ) 200 MG tablet Take 800 mg by mouth every 6 (six) hours as needed for mild pain (pain score 1-3).      lisinopril -hydrochlorothiazide  (ZESTORETIC ) 20-25 MG tablet Take 1 tablet by mouth daily. 90 tablet 1    simvastatin  (ZOCOR ) 40 MG tablet TAKE ONE (1) TABLET EACH DAY 90 tablet 1     Patient Stressors: Occupational concerns   Other: insomnia    Patient Strengths: Average or above average intelligence  Capable of independent living  Communication skills  Physical Health  Supportive family/friends  Work skills   Treatment Modalities: Medication Management, Group therapy, Case management,  1 to 1 session with  clinician, Psychoeducation, Recreational therapy.   Physician Treatment Plan for Primary Diagnosis: Severe manic bipolar 1 disorder with psychotic behavior ([name]) Long Term Goal(s):     Short Term Goals: Ability to demonstrate self-control will improve  Medication Management: Evaluate patient's response, side effects, and tolerance of medication regimen.  Therapeutic Interventions: 1 to 1 sessions, Unit Group sessions and Medication administration.  Evaluation of Outcomes: Not Progressing  Physician Treatment Plan for Secondary Diagnosis: Principal Problem:   Severe manic bipolar 1 disorder with psychotic behavior ([name])  Long Term Goal(s):     Short Term Goals: Ability to demonstrate self-control will improve     Medication Management: Evaluate patient's response, side effects, and tolerance of medication  regimen.  Therapeutic Interventions: 1 to 1 sessions, Unit Group sessions and Medication administration.  Evaluation of Outcomes: Not Progressing   RN Treatment Plan for Primary Diagnosis: Severe manic bipolar 1 disorder with psychotic behavior ([name]) Long Term Goal(s): Knowledge of disease and therapeutic regimen to maintain health will improve  Short Term Goals: Ability to remain free from injury will improve, Ability to verbalize frustration and anger appropriately will improve, Ability to demonstrate self-control, Ability to participate in decision making will improve, Ability to verbalize feelings will improve, Ability to disclose and discuss suicidal ideas, Ability to identify and develop effective coping behaviors will improve, and Compliance with prescribed medications will improve  Medication Management: RN will administer medications as ordered by provider, will assess and evaluate patient's response and provide education to patient for prescribed medication. RN will report any adverse and/or side effects to prescribing provider.  Therapeutic Interventions: 1 on 1 counseling sessions, Psychoeducation, Medication administration, Evaluate responses to treatment, Monitor vital signs and CBGs as ordered, Perform/monitor CIWA, COWS, AIMS and Fall Risk screenings as ordered, Perform wound care treatments as ordered.  Evaluation of Outcomes: Not Progressing   LCSW Treatment Plan for Primary Diagnosis: Severe manic bipolar 1 disorder with psychotic behavior ([name]) Long Term Goal(s): Safe transition to appropriate next level of care at discharge, Engage patient in therapeutic group addressing interpersonal concerns.  Short Term Goals: Engage patient in aftercare planning with referrals and resources, Increase social support, Increase ability to appropriately verbalize feelings, Increase emotional regulation, Facilitate acceptance of mental health diagnosis and concerns, Facilitate patient  progression through stages of change regarding substance use diagnoses and concerns, Identify triggers associated with mental health/substance abuse issues, and Increase skills for wellness and recovery  Therapeutic Interventions: Assess for all discharge needs, 1 to 1 time with Social worker, Explore available resources and support systems, Assess for adequacy in community support network, Educate family and significant other(s) on suicide prevention, Complete Psychosocial Assessment, Interpersonal group therapy.  Evaluation of Outcomes: Not Progressing   Progress in Treatment: Attending groups: attended one group Participating in groups: Yes. Taking medication as prescribed: Yes. Toleration medication: Yes. Consents are pending Patient understands diagnosis: No. Discussing patient identified problems/goals with staff: No. Medical problems stabilized or resolved: Yes. Denies suicidal/homicidal ideation: Yes. Issues/concerns per patient self-inventory: No.   New problem(s) identified:  No  New Short Term/Long Term Goal(s):     medication stabilization, elimination of SI thoughts, development of comprehensive mental wellness plan.    Patient Goals:  Patient was asleep in the seclusion room after receiving the agitation protocol, and was unable to participate in the scheduled treatment team meeting.    Discharge Plan or Barriers:  Patient recently admitted. CSW will continue to follow and assess for appropriate referrals and possible discharge planning.  Reason for Continuation of Hospitalization: Delusions  Hallucinations Medication stabilization  Estimated Length of Stay:  5 - 7 days  Last 3 [place] Suicide Severity Risk Score: Flowsheet Row Ad[facility] (Current) [facility] from 07/10/2024 in [facility] at [facility]  C-SSRS RISK CATEGORY No Risk No Risk    Last PHQ 2/9 Scores:     06/24/2024   12:12 PM 04/08/2024    7:59 AM 10/03/2023    7:56 AM  Depression screen PHQ 2/9  Decreased Interest 0 0 0  Down, Depressed, Hopeless 0 0 0  PHQ - 2 Score 0 0 0  Altered sleeping   0  Tired, decreased energy   0  Change in appetite   0  Feeling bad or failure about yourself    0  Trouble concentrating   0  Moving slowly or fidgety/restless   0  Suicidal thoughts   0  PHQ-9 Score   0  Difficult doing work/chores   Not difficult at all    Scribe for Treatment Team: Kierre Hintz O Anzleigh Slaven, LCSWA 07/12/2024 6:01 PM
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
## 2024-07-12 NOTE — Progress Notes (Signed)
 Tour of Duty:  Prentice JINNY Angle, RN, 07/12/24, Tour of Duty: 0700-1900  SI/HI/AVH: Denies  Self-Reported   Mood: Negative  Anxiety: Unable to Assess Depression: Unable to Assess Irritability: Unable to Assess  Broset  Violence Prevention Guidelines *See Row Information*: High Violence Risk interventions implemented   LBM  Last BM Date :  (UTA)   Pain: UTA  Patient Refusals (including Rx): Yes, including scheduled Rx  Shift Summary: Patient observed to be calm on unit after IM PRN administration. Patient able to make needs known when awake, but difficulty rousing patient. Patient observed to engage inappropriately with staff and peers, noted poor boundaries and verbal threats prior to use of restraint char [phone]. Patient taking medications as prescribed. This shift, PRN medication required and administered. No observed or reported side effects to medication. No observed or reported agitation, aggression, or other acute emotional distress. Patient breathing irregular with noted apnea, O2 sat as low as 87%, physician notified. Patient also observed to be sleeping with eyes open. No further observed or reported physical abnormalities or concerns.   Last Vitals  Vitals Weight: 108.3 kg Temp: 98.2 F (36.8 C) Temp Source: Oral Pulse Rate: 67 Resp: 14 BP: 111/82 Patient Position: (not recorded)  Admission Type  Psych Admission Type (Psych Patients Only) Admission Status: Involuntary Date 72 hour document signed : (not recorded) Time 72 hour document signed : (not recorded) Provider Notified (First and Last Name) (see details for LINK to note): (not recorded)   Psychosocial Assessment  Psychosocial Assessment Patient Complaints: None Eye Contact: Intense, Staring Facial Expression: Animated, Anxious Affect: Threatening, Preoccupied Speech: Aggressive, Tangential Interaction: Dominating, Hypervigilant Motor Activity: Hyperactive Appearance/Hygiene:  Unremarkable Behavior Characteristics: Hyperactive, Agitated Mood: Anxious, Helpless, Angry   Aggressive Behavior  Targets: (not recorded)   Thought Process  Thought Process Coherency: Disorganized, Tangential Content: Delusions, Religiosity, Paranoia Delusions: Religious, Persecutory Perception: Derealization Hallucination: Unable to assess Judgment: Poor Confusion: None  Danger to Self/Others  Danger to Self Current suicidal ideation?: Denies Description of Suicide Plan: (not recorded) Self-Injurious Behavior: (not recorded) Agreement Not to Harm Self: (not recorded) Description of Agreement: (not recorded) Danger to Others: None reported or observed

## 2024-07-12 NOTE — BHH Counselor (Signed)
 Adult Comprehensive Assessment  Patient ID: Austin Smith, male   DOB: 1966/02/06, 58 y.o.   MRN: [id number]  First attempt:  Patient was in asleep in the seclusion room after receiving the agitation protocol.  Patient was unable to participate in an assessment today.   Austin Smith, LCSWA  07/12/2024

## 2024-07-12 NOTE — H&P (Signed)
 Psychiatric Admission Assessment Adult  Patient Identification:  Austin Smith MRN:  [id number] Date of Evaluation:  07/12/2024 Chief Complaint:  Mania [facility]) [F30.9] Principal Diagnosis:  Mania ([name]) Diagnosis:  Principal Problem:   Mania ([name])    CC:   He's been off the wall  Austin Smith is a 58 y.o. male  with no previous past psychiatric history. Patient initially arrived to [facility] on 9/3 for hallucinations, delusions of grandeur, paranoia, dangerous behavior, and admitted to [facility] under IVC on 9/5 for crisis stabilization. PMHx is significant for hypertension .   HPI:   Was unable to undergo interview with patient today. On my arrival to unit, patient was yelling incoherently about Trump coming to this building and various religious themes, posturing towards and threatening staff. He required manual restraints and use of the restraint chair at ~7:45. Was given IM Haldol  5 mg x1, IM benadryl  50 mg x1, IM ativan  2 mg x1. Released from restraint chair ~8:10, sleeping soundly but unavailable for interview.   Collateral information via Austin Smith, spouse [phone]): Patient has been acting weird for the past two weeks. (Appears to have been trouble at work per chart review, during which he quit his job and then came back.) Since then: has been off the wall. He is a Naval architect. Wrote many big ideas in a journal and on napkins. 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 Hyper-religious. Spouse describes event this past Saturday 8/30: patient plays the guitar with his son at music venues. There were five squatter trucks sitting at [employer], he saw them came running into the house saying arm the system and lock up the doors, I think something's getting ready to happen. He got the gun and went running outside.  Was in a truck in [place]  when he FaceTimed wife, was spitting continuously and said do you want to watch me die. Has been seeing dead  people. In 2020-07-27, patient was having an affair and patient was different, had a demon in him. Patient acted differently around then, too. No substance use, no previous psychiatric history at all. Patient has been letting things bottle up and then he explodes but is not violent. Voiced SI to brother-in-law 9/2.  Of note, one month ago, this patient had a cough with Family Medicine NP Austin Smith. Was prescribed cough syrup with codeine in it, has been sipping on it since then. (Prescribed Hydrocodone  bit-homatropine, prescribed 8/18, around when patient began acting strangely).   Psychiatric ROS:  Patient unable to participate in interview.   Past Psychiatric History (per wife): Current psychiatrist: None Current therapist: None Previous psychiatric diagnoses: None Current psychiatric medications: None Psychiatric medication history/compliance: None Psychiatric hospitalization(s): Never Psychotherapy history: None Neuromodulation history: None History of suicide (obtained from HPI): None History of homicide or aggression (obtained in HPI): None  Substance Abuse History:  Wife denies substance use history.   Past Medical History: PCP:  Austin Smith Medical diagnoses: T2DM, HLD, HTN Medications: Aspirin , goody powder, hycodan, ibuprofen , lisinopril , hydrochlorothiazide , simvastatin  Allergies: UTA Hospitalizations: UTA Surgeries: UTA Trauma: UTA Seizures: UTA  Social History: Living situation: Lives with wife  Education: UTA Occupational history: UTA Marital status: With wife Children: Multiple children Legal: Sport and exercise psychologist: UTA  Access to firearms: Wife has locked guns up  Family Psychiatric History: Psychiatric diagnoses: UTA Suicide history: UTA  Violence/aggression: UTA Substance use history: UTA  Family Medical History:  Denies  Total Time spent with patient:  15 minutes  Is the patient at risk to self? Yes.    Has the patient been  a risk to self in the past 6 months? No.  Has the patient been a risk to self within the distant past? No.   Is the patient a risk to others? Yes.    Has the patient been a risk to others in the past 6 months? No.  Has the patient been a risk to others within the distant past? No.   [place] Scale:  Flowsheet Row [facility]) [facility] from 07/10/2024 in [facility] Emergency Department at [facility]  C-SSRS RISK CATEGORY No Risk No Risk     Tobacco Screening:  Social History   Tobacco Use  Smoking Status Never  Smokeless Tobacco Never    BH Tobacco Counseling     Are you interested in Tobacco Cessation Medications?  No, patient refused Counseled patient on smoking cessation:  Refused/Declined practical counseling Reason Tobacco Screening Not Completed: Patient Refused Screening       Social History:  Social History   Substance and Sexual Activity  Alcohol Use No     Social History   Substance and Sexual Activity  Drug Use No    Additional Social History:      Allergies:  No Known Allergies Lab Results:  Results for orders placed or performed during the hospital encounter of 07/10/24 (from the past 48 hours)  Rapid urine drug screen (hospital performed)     Status: None   Collection Time: 07/10/24  7:41 PM  Result Value Ref Range   Opiates NONE DETECTED NONE DETECTED   Cocaine NONE DETECTED NONE DETECTED   Benzodiazepines NONE DETECTED NONE DETECTED   Amphetamines NONE DETECTED NONE DETECTED   Tetrahydrocannabinol NONE DETECTED NONE DETECTED   Barbiturates NONE DETECTED NONE DETECTED    Comment: (NOTE) DRUG SCREEN FOR MEDICAL PURPOSES ONLY.  IF CONFIRMATION IS NEEDED FOR ANY PURPOSE, NOTIFY LAB WITHIN 5 DAYS.  LOWEST DETECTABLE LIMITS FOR URINE DRUG SCREEN Drug Class                     Cutoff (ng/mL) Amphetamine and metabolites    1000 Barbiturate and metabolites     200 Benzodiazepine                 200 Opiates and metabolites        300 Cocaine and metabolites        300 THC                            50 Performed at [facility], [address] [place], [place] [address]   Urinalysis, Routine w reflex microscopic -Urine, Clean Catch     Status: Abnormal   Collection Time: 07/10/24  7:41 PM  Result Value Ref Range   Color, Urine AMBER (A) YELLOW    Comment: BIOCHEMICALS MAY BE AFFECTED BY COLOR   APPearance TURBID (A) CLEAR   Specific Gravity, Urine 1.019 1.005 - 1.030   pH 5.0 5.0 - 8.0   Glucose, UA NEGATIVE NEGATIVE mg/dL   Hgb urine dipstick NEGATIVE NEGATIVE   Bilirubin Urine NEGATIVE NEGATIVE   Ketones, ur NEGATIVE NEGATIVE mg/dL   Protein, ur NEGATIVE NEGATIVE mg/dL   Nitrite NEGATIVE NEGATIVE   Leukocytes,Ua NEGATIVE NEGATIVE   RBC / HPF 0-5 0 - 5 RBC/hpf   WBC, UA 0-5  0 - 5 WBC/hpf   Bacteria, UA NONE SEEN NONE SEEN   Squamous Epithelial / HPF 0-5 0 - 5 /HPF   Mucus PRESENT    Hyaline Casts, UA PRESENT     Comment: Performed at [facility], [address], [place], [place] [address]  Comprehensive metabolic panel     Status: Abnormal   Collection Time: 07/10/24  8:04 PM  Result Value Ref Range   Sodium 133 (L) 135 - 145 mmol/L   Potassium 3.6 3.5 - 5.1 mmol/L   Chloride 97 (L) 98 - 111 mmol/L   CO2 22 22 - 32 mmol/L   Glucose, Bld 98 70 - 99 mg/dL    Comment: Glucose reference range applies only to samples taken after fasting for at least 8 hours.   BUN 24 (H) 6 - 20 mg/dL   Creatinine, Ser 8.94 0.61 - 1.24 mg/dL   Calcium 9.3 8.9 - 89.6 mg/dL   Total Protein 7.4 6.5 - 8.1 g/dL   Albumin 4.3 3.5 - 5.0 g/dL   AST 31 15 - 41 U/L   ALT 23 0 - 44 U/L   Alkaline Phosphatase 58 38 - 126 U/L   Total Bilirubin 1.0 0.0 - 1.2 mg/dL   GFR, Estimated >39 >39 mL/min    Comment: (NOTE) Calculated using the CKD-EPI Creatinine Equation (2021)    Anion gap 14 5 - 15    Comment: Performed at [facility], [address]  [place], [place] [address]  Ethanol     Status: None   Collection Time: 07/10/24  8:04 PM  Result Value Ref Range   Alcohol, Ethyl (B) <15 <15 mg/dL    Comment: (NOTE) For medical purposes only. Performed at [facility], [address], [place], [place] [address]   cbc     Status: Abnormal   Collection Time: 07/10/24  8:04 PM  Result Value Ref Range   WBC 14.3 (H) 4.0 - 10.5 K/uL   RBC 4.72 4.22 - 5.81 MIL/uL   Hemoglobin 14.7 13.0 - 17.0 g/dL   HCT 57.3 60.9 - 47.9 %   MCV 90.3 80.0 - 100.0 fL   MCH 31.1 26.0 - 34.0 pg   MCHC 34.5 30.0 - 36.0 g/dL   RDW 86.3 88.4 - 84.4 %   Platelets 264 150 - 400 K/uL   nRBC 0.0 0.0 - 0.2 %    Comment: Performed at [facility], [address], [place], [place] [address]  CBG monitoring, ED     Status: Abnormal   Collection Time: 07/10/24 10:46 PM  Result Value Ref Range   Glucose-Capillary 102 (H) 70 - 99 mg/dL    Comment: Glucose reference range applies only to samples taken after fasting for at least 8 hours.    Blood alcohol level:  Lab Results  Component Value Date   [facility] <15 07/10/2024    Metabolic disorder labs:  Lab Results  Component Value Date   HGBA1C 6.9 (H) 04/08/2024   No results found for: PROLACTIN Lab Results  Component Value Date   CHOL 147 04/08/2024   TRIG 254 (H) 04/08/2024   HDL 31 (L) 04/08/2024   CHOLHDL 4.7 04/08/2024   LDLCALC 74 04/08/2024   LDLCALC 77 10/03/2023    Current Medications: Current Facility-Administered Medications  Medication Dose Route Frequency Provider Last Rate Last Admin   aspirin  EC tablet 81 mg  81 mg Oral Daily Rollene Katz, MD       haloperidol  (HALDOL ) tablet 5 mg  5 mg Oral TID PRN Rollene Katz, MD       And   diphenhydrAMINE  (BENADRYL ) capsule 50 mg  50 mg Oral TID PRN Rollene Katz, MD       haloperidol  lactate (HALDOL ) injection 5 mg  5 mg Intramuscular TID PRN Rollene Katz, MD   5 mg at 07/12/24 0745   And   diphenhydrAMINE  (BENADRYL ) injection 50  mg  50 mg Intramuscular TID PRN Rollene Katz, MD   50 mg at 07/12/24 0745   And   LORazepam  (ATIVAN ) injection 2 mg  2 mg Intramuscular TID PRN Rollene Katz, MD   2 mg at 07/12/24 0745   haloperidol  lactate (HALDOL ) injection 10 mg  10 mg Intramuscular TID PRN Rollene Katz, MD       And   diphenhydrAMINE  (BENADRYL ) injection 50 mg  50 mg Intramuscular TID PRN Rollene Katz, MD       And   LORazepam  (ATIVAN ) injection 2 mg  2 mg Intramuscular TID PRN Rollene Katz, MD       risperiDONE  (RISPERDAL ) tablet 1 mg  1 mg Oral BID Rollene Katz, MD       simvastatin  (ZOCOR ) tablet 40 mg  40 mg Oral q1800 Rollene Katz, MD        PTA Medications: Medications Prior to Admission  Medication Sig Dispense Refill Last Dose/Taking   aspirin  EC 81 MG tablet Take 81 mg by mouth daily. Swallow whole.      Aspirin -Acetaminophen -Caffeine (GOODY HEADACHE PO) Take 1 Package by mouth as needed (headache).      HYDROcodone  bit-homatropine (HYCODAN) 5-1.5 MG/5ML syrup Take 5 mLs by mouth every 6 (six) hours as needed for cough. 120 mL 0    ibuprofen  (ADVIL ) 200 MG tablet Take 800 mg by mouth every 6 (six) hours as needed for mild pain (pain score 1-3).      lisinopril -hydrochlorothiazide  (ZESTORETIC ) 20-25 MG tablet Take 1 tablet by mouth daily. 90 tablet 1    simvastatin  (ZOCOR ) 40 MG tablet TAKE ONE (1) TABLET EACH DAY 90 tablet 1     Psychiatric Specialty Exam:  Presentation   General Appearance: Casual  Eye Contact: Minimal  Speech: Pressured  Speech Volume: Increased  Handedness: No data recorded  Mood and Affect   Mood: Angry  Affect: Congruent   Thinking   Thought Processes: Disorganized; Irrevelant  Descriptions of Associations: Tangential  Orientation: None  Thought Content: Delusions; Scattered; Tangential  History of Schizophrenia/Schizoaffective disorder:  No   Duration of Psychotic Symptoms: 2-3 weeks  Hallucinations: --  (UTA)  Ideas of Reference: Delusions; Paranoia  Suicidal Thoughts: -- (UTA)  Homicidal Thoughts: No data recorded  Sensorium    Memory: Immediate Poor  Judgment: Impaired  Insight: Shallow   Executive Functions    Concentration: Poor  Attention Span: Poor  Recall: Poor  Fund of Knowledge: Fair  Language: Fair   Musculoskeletal:  Strength & muscle tone: within normal limits Gait & station: normal Patient leans: N/A  Psychomotor Activity: Increased; Restlessness    Assets: No data recorded   Sleep: Poor    Physical Exam Vitals reviewed.  Constitutional:      General: He is in acute distress.     Comments: Acutely agitated. On later exam x2, patient sleeping comfortably  Pulmonary:     Effort: Pulmonary effort is normal. No respiratory distress.  Neurological:     Mental Status: He is alert.    Review of Systems  Unable to perform ROS: Psychiatric disorder  Blood pressure 111/82, pulse 67, temperature 98.2 F (36.8 C), temperature source Oral, resp. rate 14, height 6' 1 (1.854 m), weight 108.3 kg, SpO2 93%. Body mass index is 31.51 kg/m.   Treatment Plan Summary: Daily contact with patient to assess and evaluate symptoms and progress in treatment and Medication management   ASSESSMENT:   Austin Smith is a 58 year old male who presents in an acute manic episode with psychotic features. He is hyper-religious, posturing, delusional, and grandiose. Needed agitation medications this morning as well as manual & chair hold. Per wife (from whom the vast majority of the history was taken) patient has no psychiatric history whatsoever. Has been undergoing some stress at work, and recently was prescribed hydrocodone  for a cough 8/18. If patient was drinking large amounts of over-the-counter dextromethorphan, this may explain the origin of his psychosis as well as the negative drug screen. Unknown if he was exposed to that at this time. His presentation is  concerning for substance-induced/biological onset, as first-break presentations of primary psychotic disorders to this extent are extraordinarily rare in the sixth decade. For now we will begin Risperdal  1 mg BID for psychotic symptoms. He is appropriate for transfer to a 500-hall bed when able.   Diagnoses / Active Problems: Psychosis, unspecified  PLAN:  Safety and Monitoring: -  INVOLUNTARY  admission to inpatient psychiatric unit for safety, stabilization and treatment. - Daily contact with patient to assess and evaluate symptoms and progress in treatment - Patient's case to be discussed in multi-disciplinary team meeting -  Observation Level : q15 minute checks -  Vital signs:  q12 hours -  Precautions: suicide, elopement, and assault  2. Psychiatric Diagnoses and Treatment:    # Psychosis, unspecified  - Start Risperdal  1 mg BID - The risks/benefits/side-effects/alternatives to this medication were discussed in detail with the patient and time was given for questions. The patient consents to medication trial.  - Metabolic profile and EKG monitoring obtained while on an atypical antipsychotic  BMI: 31 TSH:  Lipid panel: Triglycerides 254, LDL 74 HbgA1c: 6.9% (has known T2DM QTc: Ordered, awaiting - Encouraged patient to participate in unit milieu and in scheduled group therapies  - Short Term Goals: Ability to demonstrate self-control will improve - Long Term Goals: Improvement in symptoms so as ready for discharge  Other PRNS: Agitation, mild pain  Other labs reviewed on admission: WBC 14.2, Na 133   3. Medical Issues Being Addressed:   # HTN, HLD - Restart home lisinopril , statin  #Leukocytosis to 14 Continue to monitor clincially   #Dehydration Encourage PO   4. Discharge Planning:   - Estimated discharge date: 5-7 days - Social work and case management to assist with discharge planning and identification of hospital follow-up needs prior to discharge. -  Discharge concerns: Need to establish a safety plan; medication compliance and effectiveness. - Discharge goals: Return home with outpatient referrals for mental health follow-up including medication management/psychotherapy.  I certify that inpatient services furnished can reasonably be expected to improve the patient's condition.    NB: This note was created using a voice recognition software as a result there may be grammatical errors inadvertently enclosed that do not reflect the nature of this encounter. Every attempt is made to correct such errors.   Odis Cleveland, MD PGY-2, Psychiatry Residency  9/5/20253:36 PM

## 2024-07-12 NOTE — Plan of Care (Signed)
   Problem: Education: Goal: Emotional status will improve Outcome: Not Progressing Goal: Mental status will improve Outcome: Not Progressing

## 2024-07-12 NOTE — BHH Suicide Risk Assessment (Signed)
 Suicide Risk Assessment  Nursing information obtained from:  Patient Demographic factors:  Male Current Mental Status:  NA Loss Factors:  NA Historical Factors:  NA Risk Reduction Factors:  Positive therapeutic relationship, Employed, Sense of responsibility to family, Living with another person, especially a relative, Positive social support  Total Time spent with patient: 15 minutes Principal Problem: Mania ([name]) Diagnosis:  Principal Problem:   Mania ([name])   Subjective Data:   Austin Smith is a 58 y.o. male  with no previous past psychiatric history. Patient initially arrived to [facility] on 9/3 for hallucinations, delusions of grandeur, paranoia, dangerous behavior, and admitted to [facility] under IVC on 9/5 for crisis stabilization. PMHx is significant for hypertension .   Continued Clinical Symptoms:  Alcohol Use Disorder Identification Test Final Score (AUDIT): 0 The Alcohol Use Disorders Identification Test, Guidelines for Use in Primary Care, Second Edition.  World Science writer [facility]). Score between 0-7:  no or low risk or alcohol related problems. Score between 8-15:  moderate risk of alcohol related problems. Score between 16-19:  high risk of alcohol related problems. Score 20 or above:  warrants further diagnostic evaluation for alcohol dependence and treatment.   CLINICAL FACTORS:   Currently Psychotic   Psychiatric Specialty Exam:   Presentation    General Appearance: Casual   Eye Contact: Minimal   Speech: Pressured   Speech Volume: Increased   Handedness: No data recorded   Mood and Affect    Mood: Angry   Affect: Congruent     Thinking     Thought Processes: Disorganized; Irrevelant   Descriptions of Associations: Tangential   Orientation: None   Thought Content: Delusions; Scattered; Tangential   History of Schizophrenia/Schizoaffective disorder:  No     Duration of Psychotic Symptoms: 2-3 weeks   Hallucinations: -- (UTA)   Ideas  of Reference: Delusions; Paranoia   Suicidal Thoughts: -- (UTA)   Homicidal Thoughts: No data recorded   Sensorium      Memory: Immediate Poor   Judgment: Impaired   Insight: Shallow     Executive Functions      Concentration: Poor   Attention Span: Poor   Recall: Poor   Fund of Knowledge: Fair   Language: Fair     Musculoskeletal:   Strength & muscle tone: within normal limits Gait & station: normal Patient leans: N/A   Psychomotor Activity: Increased; Restlessness       Assets: No data recorded     Sleep: Poor       Physical Exam Vitals reviewed.  Constitutional:      General: He is in acute distress.     Comments: Acutely agitated. On later exam x2, patient sleeping comfortably  Pulmonary:     Effort: Pulmonary effort is normal. No respiratory distress.  Neurological:     Mental Status: He is alert.     Review of Systems  Unable to perform ROS: Psychiatric disorder   Blood pressure 111/82, pulse 67, temperature 98.2 F (36.8 C), temperature source Oral, resp. rate 14, height 6' 1 (1.854 m), weight 108.3 kg, SpO2 93%. Body mass index is 31.51 kg/m.     COGNITIVE FEATURES THAT CONTRIBUTE TO RISK:  Closed-mindedness, Loss of executive function, and Thought constriction (tunnel vision)    HOMICIDE/SUICIDE RISK:  Severe: Patient appears to be in acute psychosis, with significant manic features. Paranoid, loose associations, flight of ideas, hyperreligious. Has made suicidal statements to family and waved a gun around at home during an episode of acute  paranoia. While he has no significant history of suicidal/homicidal thinking, he is extremely unpredictable in this state, increasing his risk of suicidal and homicidal events to SEVERE.   PLAN OF CARE: See H&P for assessment and plan.   I certify that inpatient services furnished can reasonably be expected to improve the patient's condition.   Sahily Biddle, MD 07/12/2024, 4:11 PM

## 2024-07-12 NOTE — Progress Notes (Signed)
 Shift Note  (Sleep Hours) - 8  (Any PRNs that were needed, meds refused, or side effects to meds)- None  (Any disturbances and when (visitation, over night)-None  (Concerns raised by the patient)- None  (SI/HI/AVH)- Denies

## 2024-07-13 MED ORDER — LITHIUM CARBONATE ER 300 MG PO TBCR
300.0000 mg | EXTENDED_RELEASE_TABLET | Freq: Two times a day (BID) | ORAL | Status: DC
  Administered 2024-07-14: 300 mg via ORAL
  Filled 2024-07-13: qty 1

## 2024-07-13 MED ORDER — TRAZODONE HCL 100 MG PO TABS: 50.0000 mg | ORAL_TABLET | Freq: Every evening | ORAL | Status: DC | PRN

## 2024-07-13 MED ORDER — RISPERIDONE 0.5 MG PO TABS: 2.0000 mg | ORAL_TABLET | Freq: Every day | ORAL | Status: DC

## 2024-07-13 MED ORDER — IBUPROFEN 200 MG PO TABS
600.0000 mg | ORAL_TABLET | Freq: Four times a day (QID) | ORAL | Status: DC | PRN
  Administered 2024-07-13: 600 mg via ORAL
  Filled 2024-07-13: qty 1

## 2024-07-13 MED ORDER — RISPERIDONE 1 MG PO TABS
1.0000 mg | ORAL_TABLET | Freq: Two times a day (BID) | ORAL | Status: AC
  Administered 2024-07-13: 1 mg via ORAL
  Filled 2024-07-13: qty 1

## 2024-07-13 NOTE — [facility] Group Notes (Signed)
 Adult Psychoeducational Group Note  Date:  07/13/2024 Time:  7:34 PM  Group Topic/Focus:  Goals Group:   The focus of this group is to help patients establish daily goals to achieve during treatment and discuss how the patient can incorporate goal setting into their daily lives to aide in recovery. Orientation:   The focus of this group is to educate the patient on the purpose and policies of crisis stabilization and provide a format to answer questions about their admission.  The group details unit policies and expectations of patients while admitted.  Participation Level:  Did Not Attend  Participation Quality:    Affect:    Cognitive:    Insight:   Engagement in Group:    Modes of Intervention:    Additional Comments:    Jacari Iannello O 07/13/2024, 7:34 PM
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
## 2024-07-13 NOTE — Group Note (Signed)
 Date:  07/13/2024 Time:  8:38 PM  Group Topic/Focus:  Wrap-Up Group:   The focus of this group is to help patients review their daily goal of treatment and discuss progress on daily workbooks.    Participation Level:  Did Not Attend   Austin Smith 07/13/2024, 8:38 PM

## 2024-07-13 NOTE — Plan of Care (Addendum)
 Pt observed with bright affect, animated and pleasant on interactions. Denies SI, HI, AVH and pain when assessed. Reports he slept well last That's the best sleep I've ever got in 10 years. Visible in dayroom at intervals for scheduled groups. Observed falling asleep at medication window this evening, almost to the point of falling to the floor. Escorted to bed via wheelchair. EKG done, placed on chart. Safety checks maintained at Q 15 minutes intervals without outburst.  Problem: Activity: Goal: Interest or engagement in activities will improve Outcome: Progressing   Problem: Coping: Goal: Ability to demonstrate self-control will improve Outcome: Progressing   Problem: Activity: Goal: Interest or engagement in activities will improve Outcome: Progressing
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
## 2024-07-14 ENCOUNTER — Inpatient Hospital Stay (HOSPITAL_COMMUNITY)

## 2024-07-14 ENCOUNTER — Encounter (HOSPITAL_COMMUNITY): Payer: Self-pay

## 2024-07-14 ENCOUNTER — Other Ambulatory Visit: Payer: Self-pay

## 2024-07-14 ENCOUNTER — Encounter (HOSPITAL_COMMUNITY): Payer: Self-pay | Admitting: Internal Medicine

## 2024-07-14 ENCOUNTER — Observation Stay (HOSPITAL_COMMUNITY)
Admission: EM | Admit: 2024-07-14 | Discharge: 2024-07-15 | Disposition: A | Discharge status: Home / Self Care | Source: Intra-hospital | Attending: Internal Medicine | Admitting: Internal Medicine

## 2024-07-14 DIAGNOSIS — E66811 Obesity, class 1: Secondary | ICD-10-CM | POA: Insufficient documentation

## 2024-07-14 DIAGNOSIS — F312 Bipolar disorder, current episode manic severe with psychotic features: Secondary | ICD-10-CM | POA: Insufficient documentation

## 2024-07-14 DIAGNOSIS — N179 Acute kidney failure, unspecified: Principal | ICD-10-CM | POA: Insufficient documentation

## 2024-07-14 DIAGNOSIS — Z7982 Long term (current) use of aspirin: Secondary | ICD-10-CM | POA: Insufficient documentation

## 2024-07-14 DIAGNOSIS — Z79899 Other long term (current) drug therapy: Secondary | ICD-10-CM | POA: Insufficient documentation

## 2024-07-14 DIAGNOSIS — E119 Type 2 diabetes mellitus without complications: Secondary | ICD-10-CM | POA: Diagnosis not present

## 2024-07-14 DIAGNOSIS — E1159 Type 2 diabetes mellitus with other circulatory complications: Secondary | ICD-10-CM | POA: Diagnosis present

## 2024-07-14 DIAGNOSIS — E1169 Type 2 diabetes mellitus with other specified complication: Secondary | ICD-10-CM | POA: Insufficient documentation

## 2024-07-14 DIAGNOSIS — Z781 Physical restraint status: Secondary | ICD-10-CM | POA: Diagnosis not present

## 2024-07-14 DIAGNOSIS — Z6831 Body mass index (BMI) 31.0-31.9, adult: Secondary | ICD-10-CM | POA: Insufficient documentation

## 2024-07-14 DIAGNOSIS — E785 Hyperlipidemia, unspecified: Secondary | ICD-10-CM | POA: Insufficient documentation

## 2024-07-14 LAB — CBC
HCT: 45.7 % (ref 39.0–52.0)
Hemoglobin: 15.1 g/dL (ref 13.0–17.0)
MCH: 30.9 pg (ref 26.0–34.0)
MCHC: 33 g/dL (ref 30.0–36.0)
MCV: 93.6 fL (ref 80.0–100.0)
Platelets: 219 K/uL (ref 150–400)
RBC: 4.88 MIL/uL (ref 4.22–5.81)
RDW: 14.1 % (ref 11.5–15.5)
WBC: 12.2 K/uL — ABNORMAL HIGH (ref 4.0–10.5)
nRBC: 0 % (ref 0.0–0.2)

## 2024-07-14 LAB — URINALYSIS, ROUTINE W REFLEX MICROSCOPIC
Bilirubin Urine: NEGATIVE
Glucose, UA: NEGATIVE mg/dL
Hgb urine dipstick: NEGATIVE
Ketones, ur: NEGATIVE mg/dL
Nitrite: NEGATIVE
Protein, ur: 100 mg/dL — AB
Specific Gravity, Urine: 1.018 (ref 1.005–1.030)
WBC, UA: >50 WBC/hpf (ref 0–5)
pH: 5 (ref 5.0–8.0)

## 2024-07-14 LAB — CK: Total CK: 541 U/L — ABNORMAL HIGH (ref 49–397)

## 2024-07-14 LAB — BASIC METABOLIC PANEL WITH GFR
Anion gap: 16 — ABNORMAL HIGH (ref 5–15)
BUN: 36 mg/dL — ABNORMAL HIGH (ref 6–20)
CO2: 24 mmol/L (ref 22–32)
Calcium: 10 mg/dL (ref 8.9–10.3)
Chloride: 95 mmol/L — ABNORMAL LOW (ref 98–111)
Creatinine, Ser: 3.86 mg/dL — ABNORMAL HIGH (ref 0.61–1.24)
GFR, Estimated: 17 mL/min — ABNORMAL LOW (ref >60)
Glucose, Bld: 102 mg/dL — ABNORMAL HIGH (ref 70–99)
Potassium: 4 mmol/L (ref 3.5–5.1)
Sodium: 135 mmol/L (ref 135–145)

## 2024-07-14 LAB — GLUCOSE, CAPILLARY
Glucose-Capillary: 96 mg/dL (ref 70–99)
Glucose-Capillary: 101 mg/dL — ABNORMAL HIGH (ref 70–99)

## 2024-07-14 LAB — TSH: TSH: 3.38 u[IU]/mL (ref 0.350–4.500)

## 2024-07-14 MED ORDER — ACETAMINOPHEN 325 MG PO TABS: 650.0000 mg | ORAL_TABLET | Freq: Four times a day (QID) | ORAL | Status: DC | PRN

## 2024-07-14 MED ORDER — ENOXAPARIN SODIUM 30 MG/0.3ML IJ SOSY
30.0000 mg | PREFILLED_SYRINGE | INTRAMUSCULAR | Status: DC
  Administered 2024-07-14: 30 mg via SUBCUTANEOUS
  Filled 2024-07-14: qty 0.3

## 2024-07-14 MED ORDER — SODIUM CHLORIDE 0.9 % IV SOLN: INTRAVENOUS | Status: DC

## 2024-07-14 MED ORDER — ACETAMINOPHEN 650 MG RE SUPP: 650.0000 mg | Freq: Four times a day (QID) | RECTAL | Status: DC | PRN

## 2024-07-14 MED ORDER — LITHIUM CARBONATE ER 300 MG PO TBCR: 300.0000 mg | EXTENDED_RELEASE_TABLET | Freq: Two times a day (BID) | ORAL | Status: DC

## 2024-07-14 MED ORDER — SODIUM CHLORIDE 0.9 % IV BOLUS
1000.0000 mL | Freq: Once | INTRAVENOUS | Status: AC
  Administered 2024-07-14: 1000 mL via INTRAVENOUS

## 2024-07-14 MED ORDER — INSULIN ASPART 100 UNIT/ML IJ SOLN: 0.0000 [IU] | Freq: Three times a day (TID) | INTRAMUSCULAR | Status: DC

## 2024-07-14 MED ORDER — RISPERIDONE 1 MG PO TABS
2.0000 mg | ORAL_TABLET | Freq: Every day | ORAL | Status: DC
  Administered 2024-07-14: 2 mg via ORAL
  Filled 2024-07-14: qty 2

## 2024-07-14 MED ORDER — LITHIUM CARBONATE ER 300 MG PO TBCR
300.0000 mg | EXTENDED_RELEASE_TABLET | Freq: Two times a day (BID) | ORAL | Status: DC
  Administered 2024-07-14 – 2024-07-15 (×2): 300 mg via ORAL
  Filled 2024-07-14 (×4): qty 1

## 2024-07-14 MED ORDER — LACTATED RINGERS IV BOLUS: 1000.0000 mL | Freq: Once | INTRAVENOUS | Status: DC

## 2024-07-14 MED ORDER — ONDANSETRON HCL 4 MG/2ML IJ SOLN: 4.0000 mg | Freq: Four times a day (QID) | INTRAMUSCULAR | Status: DC | PRN

## 2024-07-14 MED ORDER — ONDANSETRON HCL 4 MG PO TABS: 4.0000 mg | ORAL_TABLET | Freq: Four times a day (QID) | ORAL | Status: DC | PRN

## 2024-07-14 MED ORDER — RISPERIDONE 2 MG PO TABS: 2.0000 mg | ORAL_TABLET | Freq: Every day | ORAL | Status: DC

## 2024-07-14 NOTE — Plan of Care (Signed)

## 2024-07-14 NOTE — ED Notes (Signed)
 Post void residual zero

## 2024-07-14 NOTE — H&P (Signed)
 History and Physical    Patient: Austin Smith FMW:[id number] DOB: 1966-01-24 DOA: (Not on file) DOS: the patient was seen and examined on 07/14/2024 PCP: Gladis Mustard, FNP  Patient coming from: Home  Chief Complaint: Acute kidney injury.  HPI: Austin Smith is a 58 y.o. Austin with medical history significant of class I obesity, prediabetes, hyperlipidemia, hypertension, bipolar 1 disorder with psychotic behavior who was seen 4 days ago at [facility] emergency department with hallucinations, paranoia, delusions of grounder, and was admitted to behavioral health under IVC 2 days ago on 07/12/2024 for treatment.  He was sent today to the emergency department due to acute kidney injury.  He stated he had 3 episodes of diarrhea while at the behavioral health hospital.  He is on lisinopril  20 mg and hydrochlorothiazide  25 mg p.o. daily for hypertension.  He has felt lightheaded, but no chest pain, palpitations, diaphoresis, PND, orthopnea or pitting edema of the lower extremities.  He stated that his urine looked darker, he has been urinating less than usual, but no flank pain, dysuria, frequency or hematuria. He denied fever, chills, rhinorrhea, sore throat, wheezing or hemoptysis.  No abdominal pain, nausea, emesis, constipation, melena or hematochezia.  No polyuria, polydipsia, polyphagia or blurred vision.   ED course: Initial vital signs were temperature 97.1 F, pulse 62, respiration 21, BP 71/37 mmHg O2 sat 100% on room air.  The patient received 1000 mL of normal saline bolus and I added 2000 mL of normal saline bolus afterwards.  Lab work: Urinalysis was cloudy with protein of 100 mg/deciliter, small leukocyte esterase, greater than 50 WBC and rare bacteria.  CBC showed a white count of 12.2, hemoglobin 15.1 g/dL and platelets 780.  BMP showed chloride of 95 mmol/L, but the rest of the electrolytes were normal.  Anion gap mildly increased at 16.  Glucose unknown 2, BUN 36 and creatinine 3.86 mg/dL.   He has creatinine levels 4 days ago's was 1.05 mg/dL.   Review of Systems: As mentioned in the history of present illness. All other systems reviewed and are negative. Past Medical History:  Diagnosis Date   Borderline diabetic    Hyperlipidemia    Hypertension    No past surgical history on file. Social History:  reports that he has never smoked. He has never used smokeless tobacco. He reports that he does not drink alcohol and does not use drugs.  No Known Allergies  Family History  Problem Relation Age of Onset   Heart disease Mother        atrial fibrillation   Cancer Paternal Aunt    Cancer Maternal Grandmother        breast   Early death Paternal Grandmother 33       myocardial in   Alzheimer's disease Paternal Grandfather    Kidney Stones Brother    Thyroid disease Brother        produce too much calcium which cause kidney stones    Prior to Admission medications   Medication Sig Start Date End Date Taking? Authorizing Provider  aspirin  EC 81 MG tablet Take 81 mg by mouth daily. Swallow whole.    [provider]  Aspirin -Acetaminophen -Caffeine (GOODY HEADACHE PO) Take 1 Package by mouth as needed (headache).    [provider]  HYDROcodone  bit-homatropine (HYCODAN) 5-1.5 MG/5ML syrup Take 5 mLs by mouth every 6 (six) hours as needed for cough. 06/24/24   Gladis Mary-Margaret, FNP  ibuprofen  (ADVIL ) 200 MG tablet Take 800 mg by mouth every 6 (  six) hours as needed for mild pain (pain score 1-3).    [provider]  lisinopril -hydrochlorothiazide  (ZESTORETIC ) 20-25 MG tablet Take 1 tablet by mouth daily. 04/08/24   Gladis Mustard, FNP  simvastatin  (ZOCOR ) 40 MG tablet TAKE ONE (1) TABLET EACH DAY 04/08/24   Gladis Mustard, FNP    Physical Exam: There were no vitals filed for this visit. Physical Exam Vitals and nursing note reviewed.  Constitutional:      Appearance: He is ill-appearing.  HENT:     Head: Normocephalic.     Nose:  No rhinorrhea.  Eyes:     General: No scleral icterus.    Pupils: Pupils are equal, round, and reactive to light.  Pulmonary:     Effort: Pulmonary effort is normal.     Breath sounds: Normal breath sounds. No wheezing, rhonchi or rales.  Abdominal:     General: Bowel sounds are normal. There is no distension.     Palpations: Abdomen is soft.     Tenderness: There is no abdominal tenderness. There is no right CVA tenderness or left CVA tenderness.  Musculoskeletal:     Right lower leg: No edema.     Left lower leg: No edema.  Skin:    General: Skin is warm and dry.  Neurological:     General: No focal deficit present.     Mental Status: He is alert and oriented to person, place, and time.  Psychiatric:        Mood and Affect: Mood normal.        Behavior: Behavior normal.     Data Reviewed:  Results are pending, will review when available.  EKG: Vent. rate 57 BPM PR interval 188 ms QRS duration 115 ms QT/QTcB 496/483 ms P-R-T axes 31 -48 5 Sinus rhythm Incomplete RBBB and LAFB Baseline wander in lead(s) V3 V6  Assessment and Plan: Principal Problem:   AKI (acute kidney injury) ([name]) Observation/MedSurg. Continue IV fluids. Hold ARB/ACE. Hold diuretic. Avoid hypotension. Avoid nephrotoxins. Monitor intake and output. Monitor renal function/electrolytes. Check renal ultrasound.  Active Problems:   Hyperlipidemia associated with type 2 diabetes mellitus ([name]) Check total CK level. Resume simvastatin  if normal.    Type 2 diabetes mellitus without complication,    without long-term current use of insulin  ([name]) Carbohydrate modified diet. CBG monitoring with RI SS. Check hemoglobin A1c.    Hypertension associated with type 2 diabetes mellitus ([name]) Was hypotensive earlier. Hold antihypertensives for now. Monitor blood pressure.    Severe manic bipolar 1 disorder with psychotic behavior [facility]) Psych consult requested.    Advance Care Planning:   Code  Status: Full Code   Consults: Behavioral health.  Family Communication:   Severity of Illness: The appropriate patient status for this patient is INPATIENT. Inpatient status is judged to be reasonable and necessary in order to provide the required intensity of service to ensure the patient's safety. The patient's presenting symptoms, physical exam findings, and initial radiographic and laboratory data in the context of their chronic comorbidities is felt to place them at high risk for further clinical deterioration. Furthermore, it is not anticipated that the patient will be medically stable for discharge from the hospital within 2 midnights of admission.   * I certify that at the point of admission it is my clinical judgment that the patient will require inpatient hospital care spanning beyond 2 midnights from the point of admission due to high intensity of service, high risk for further deterioration and high frequency  of surveillance required.*  Author: Alm Dorn Castor, MD 07/14/2024 11:56 AM  For on call review [email or web address].   This document was prepared using Dragon voice recognition software and may contain some unintended transcription errors.

## 2024-07-14 NOTE — Discharge Summary (Signed)
 Physician Discharge Summary Note  Patient:  Austin Smith is an 58 y.o., male MRN:  [id number] DOB:  12-14-1965 Patient phone:  [phone] (home)  Patient address:   [address] [place] [address],  Total Time spent with patient: 45 minutes  Date of Admission:  07/11/2024 Date of Discharge: 07/14/2024  Reason for Admission:  mania w/ psychotic features  Principal Problem: Severe manic bipolar 1 disorder with psychotic behavior [facility]) Discharge Diagnoses: Principal Problem:   Severe manic bipolar 1 disorder with psychotic behavior ([name])    Past Psychiatric History (per wife): Current psychiatrist: None Current therapist: None Previous psychiatric diagnoses: None Current psychiatric medications: None Psychiatric medication history/compliance: None Psychiatric hospitalization(s): Never Psychotherapy history: None Neuromodulation history: None History of suicide (obtained from HPI): None History of homicide or aggression (obtained in HPI): None   Substance Abuse History:   Wife denies substance use history.    Past Medical History: PCP:  Ronal Johnita Lunger Medical diagnoses: T2DM, HLD, HTN Medications: Aspirin , goody powder, hycodan, ibuprofen , lisinopril , hydrochlorothiazide , simvastatin  Allergies: UTA Hospitalizations: UTA Surgeries: UTA Trauma: UTA Seizures: UTA   Social History: Living situation: Lives with wife  Education: UTA Occupational history: UTA Marital status: With wife Children: Multiple children Legal: Sport and exercise psychologist: UTA   Access to firearms: Wife has locked guns up   Family Psychiatric History: Psychiatric diagnoses: UTA Suicide history: UTA  Violence/aggression: UTA Substance use history: UTA   Family Medical History: Denies    Hospital course: Patient initially came in very agitated and psychotic including hyperreligiosity, disorganization and received several agitation protocols and by second full day was doing fairly well.   Will still having some hyperverbal speech and circumferential thought process but was increasingly appropriate.  Patient had been started on risperidone  1 mg twice daily and transition to 2 mg at nighttime.  Patient was also initiated on 9/7 on lithium  300 mg twice daily.  Prior to patient receiving his morning meds on 9/7 he was found to have hypotension ~70/40 and was transferred to the emergency department.  Patient is to be admitted to the medical hospital for AKI.  Patient's diagnosis is most likely representative bipolar 1 disorder but may have a substance-induced component from dextromethorphan as patient had been taking cough medicine prior to admission.   Collateral, wife, [phone]: informed her that patient transferred to [facility].   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 as patient says he was taking Copa send prior to admission  Psych meds: Risperidone  2 mg at bedtime Lithium  300 mg twice daily    Musculoskeletal: Strength & Muscle Tone: within normal limits Gait & Station: normal Patient leans: N/A   Psychiatric Specialty Exam:  Presentation  General Appearance:  -- (casual, wearing gown wrong and doesnt seem to notice)  Eye Contact: Good  Speech: Pressured  Speech Volume: Increased  Handedness:No data recorded  Mood and Affect  Mood: Euthymic  Affect: Appropriate (elevated)   Thought Process  Thought Processes: Coherent (circumferential)  Descriptions of Associations:Circumstantial  Orientation:Full (Time, Place and Person)  Thought Content:Logical (Less delusional than previous encounters.  Does not describe hyperreligious delusions.  Perceives current presentation as fatigue.  Denies paranoia)  History of Schizophrenia/Schizoaffective disorder:No  Duration of Psychotic Symptoms:N/A  Hallucinations:Hallucinations: None  Ideas of  Reference:None  Suicidal Thoughts:Suicidal Thoughts: No  Homicidal Thoughts:Homicidal Thoughts: No   Sensorium  Memory: Immediate Good; Recent Good; Remote Good  Judgment: Fair (Agreeable to medications.  Agreeable to stay  in the hospital currently.)  Insight: Poor (Thinks he is fatigued)   Art therapist  Concentration: Good  Attention Span: Good  Recall: Fair  Fund of Knowledge: Good  Language: Good   Psychomotor Activity  Psychomotor Activity: Psychomotor Activity: Normal (No EPS.)   Assets  Assets: Desire for Improvement; Social Support; Vocational/Educational   Sleep  Sleep: Sleep: Poor (Woke up at 4 AM screaming.  Otherwise slept well but also got as needed Benadryl .  Was not appearing this morning.)  Estimated Sleeping Duration (Last 24 Hours): 8.25-9.75 hours   Physical Exam: Did not perform exam on patient today as he transferred to [facility] prior to assessment,  Blood pressure 125/80, pulse 63, temperature 97.8 F (36.6 C), resp. rate 18, height 6' 1 (1.854 m), weight 108.3 kg, SpO2 91%. Body mass index is 31.51 kg/m.   Social History   Tobacco Use  Smoking Status Never  Smokeless Tobacco Never   Tobacco Cessation:  N/A, patient does not currently use tobacco products   Blood Alcohol level:  Lab Results  Component Value Date   [facility] <15 07/10/2024    Metabolic Disorder Labs:  Lab Results  Component Value Date   HGBA1C 6.9 (H) 04/08/2024   No results found for: PROLACTIN Lab Results  Component Value Date   CHOL 147 04/08/2024   TRIG 254 (H) 04/08/2024   HDL 31 (L) 04/08/2024   CHOLHDL 4.7 04/08/2024   LDLCALC 74 04/08/2024   LDLCALC 77 10/03/2023    See Psychiatric Specialty Exam and Suicide Risk Assessment completed by Attending Physician prior to discharge.  Discharge destination: [facility]  Is patient on multiple antipsychotic therapies at discharge:  No     Discharge Instructions     Diet  - low sodium heart healthy   Complete by: As directed    Increase activity slowly   Complete by: As directed       Allergies as of 07/14/2024   No Known Allergies      Medication List     TAKE these medications      Indication  aspirin  EC 81 MG tablet Take 81 mg by mouth daily. Swallow whole.    GOODY HEADACHE PO Take 1 Package by mouth as needed (headache).    HYDROcodone  bit-homatropine 5-1.5 MG/5ML syrup Commonly known as: HYCODAN Take 5 mLs by mouth every 6 (six) hours as needed for cough.    ibuprofen  200 MG tablet Commonly known as: ADVIL  Take 800 mg by mouth every 6 (six) hours as needed for mild pain (pain score 1-3).    lisinopril -hydrochlorothiazide  20-25 MG tablet Commonly known as: ZESTORETIC  Take 1 tablet by mouth daily.    lithium  carbonate 300 MG ER tablet Commonly known as: LITHOBID  Take 1 tablet (300 mg total) by mouth every 12 (twelve) hours.    risperiDONE  2 MG tablet Commonly known as: RISPERDAL  Take 1 tablet (2 mg total) by mouth at bedtime.    simvastatin  40 MG tablet Commonly known as: ZOCOR  TAKE ONE (1) TABLET EACH DAY         Follow-up Information     Services, [facility]. Schedule an appointment as soon as possible for a visit.   Contact information: [address] [place] [address] [phone]         Gladis Mustard, FNP. Schedule an appointment as soon as possible for a visit.   Specialty: Family Medicine Contact information: [address] [place] [address] [phone]  Follow-up recommendations:   Consider whether patient would benefit from lithium  and risperidone  or consider tapering to just risperidone .  We would recommend doing both given the significance of his symptoms at presentation.  Recommend doing lithium  level prior to hospital discharge ideally 3 to 5 days after initiation.  Ensure that patient is aware that he can follow-up at [facility] in [place].     Signed: Justino Cornish, MD 07/14/2024, 1:19 PM

## 2024-07-14 NOTE — ED Triage Notes (Signed)
 Pt BIBA from [facility] for hypotension.  Per EMS, pt started haldol  yesterday, this morning vitals show sys in 80's   92/50 58/ HR (normal for pt) 98% RA 131CBG

## 2024-07-14 NOTE — ED Provider Notes (Signed)
 [place] EMERGENCY DEPARTMENT AT [facility] Provider Note   CSN: [id number] Arrival date & time: 07/14/24  9244     Patient presents with: Hypotension   Austin Smith is a 58 y.o. male.   HPI Patient presents from behavioral health with hypotension.  Pressures in the 70s and 80s there.  States he feels a little lightheaded.  Recently has been started on new medicines.  Appears to gotten Haldol  and Benadryl  yesterday and the day before.  Also on blood pressure medicines.  States he always has a slow heart rate.  Has felt a little lightheaded.  Did have diarrhea a couple days ago.    Prior to Admission medications   Medication Sig Start Date End Date Taking? Authorizing Provider  aspirin  EC 81 MG tablet Take 81 mg by mouth daily. Swallow whole.    [provider]  Aspirin -Acetaminophen -Caffeine (GOODY HEADACHE PO) Take 1 Package by mouth as needed (headache).    [provider]  HYDROcodone  bit-homatropine (HYCODAN) 5-1.5 MG/5ML syrup Take 5 mLs by mouth every 6 (six) hours as needed for cough. 06/24/24   Gladis, Mary-Margaret, FNP  ibuprofen  (ADVIL ) 200 MG tablet Take 800 mg by mouth every 6 (six) hours as needed for mild pain (pain score 1-3).    [provider]  lisinopril -hydrochlorothiazide  (ZESTORETIC ) 20-25 MG tablet Take 1 tablet by mouth daily. 04/08/24   Gladis Mustard, FNP  simvastatin  (ZOCOR ) 40 MG tablet TAKE ONE (1) TABLET EACH DAY 04/08/24   Gladis Mustard, FNP    Allergies: Patient has no known allergies.    Review of Systems  Updated Vital Signs BP (!) 98/59 (BP Location: Right Arm)   Pulse (!) 55   Temp 97.6 F (36.4 C) (Oral)   Resp 18   Ht 6' 1 (1.854 m)   Wt 108.3 kg   SpO2 98%   BMI 31.51 kg/m   Physical Exam Vitals and nursing note reviewed.  Cardiovascular:     Rate and Rhythm: Bradycardia present.  Abdominal:     Tenderness: There is no abdominal tenderness.  Musculoskeletal:        General: No  tenderness.  Neurological:     Mental Status: He is alert and oriented to person, place, and time.     (all labs ordered are listed, but only abnormal results are displayed) Labs Reviewed  BASIC METABOLIC PANEL WITH GFR - Abnormal; Notable for the following components:      Result Value   Chloride 95 (*)    Glucose, Bld 102 (*)    BUN 36 (*)    Creatinine, Ser 3.86 (*)    GFR, Estimated 17 (*)    Anion gap 16 (*)    All other components within normal limits  CBC - Abnormal; Notable for the following components:   WBC 12.2 (*)    All other components within normal limits  URINALYSIS, ROUTINE W REFLEX MICROSCOPIC - Abnormal; Notable for the following components:   APPearance CLOUDY (*)    Protein, ur 100 (*)    Leukocytes,Ua SMALL (*)    Bacteria, UA RARE (*)    All other components within normal limits  TSH  PROLACTIN  LITHIUM  LEVEL    EKG: EKG Interpretation Date/Time:  Sunday July 14 2024 08:01:45 EDT Ventricular Rate:  57 PR Interval:  188 QRS Duration:  115 QT Interval:  496 QTC Calculation: 483 R Axis:   -48  Text Interpretation: Sinus rhythm Incomplete RBBB and LAFB Baseline wander in lead(s)  V3 V6 Confirmed by Patsey Lot [phone]) on 07/14/2024 9:20:38 AM  Radiology: No results found.   Procedures   Medications Ordered in the ED  haloperidol  (HALDOL ) tablet 5 mg (5 mg Oral Given 07/13/24 0417)    And  diphenhydrAMINE  (BENADRYL ) capsule 50 mg (50 mg Oral Given 07/13/24 0416)  haloperidol  lactate (HALDOL ) injection 5 mg (5 mg Intramuscular Given 07/12/24 0745)    And  diphenhydrAMINE  (BENADRYL ) injection 50 mg (50 mg Intramuscular Given 07/12/24 0745)    And  LORazepam  (ATIVAN ) injection 2 mg (2 mg Intramuscular Given 07/12/24 0745)  haloperidol  lactate (HALDOL ) injection 10 mg (has no administration in time range)    And  diphenhydrAMINE  (BENADRYL ) injection 50 mg (has no administration in time range)    And  LORazepam  (ATIVAN ) injection 2 mg (has no  administration in time range)  aspirin  EC tablet 81 mg (81 mg Oral Given 07/13/24 0903)  simvastatin  (ZOCOR ) tablet 40 mg (40 mg Oral Given 07/13/24 1825)  acetaminophen  (TYLENOL ) tablet 650 mg (has no administration in time range)  hydrOXYzine  (ATARAX ) tablet 25 mg (25 mg Oral Given 07/13/24 0416)  ibuprofen  (ADVIL ) tablet 600 mg (600 mg Oral Given 07/13/24 1810)  traZODone  (DESYREL ) tablet 50 mg (has no administration in time range)  risperiDONE  (RISPERDAL ) tablet 2 mg (has no administration in time range)  lithium  carbonate (LITHOBID ) ER tablet 300 mg (has no administration in time range)  0.9 %  sodium chloride  infusion (has no administration in time range)  risperiDONE  (RISPERDAL ) tablet 1 mg (1 mg Oral Given 07/13/24 1651)  sodium chloride  0.9 % bolus 1,000 mL ( Intravenous Restarted 07/14/24 0959)  sodium chloride  0.9 % bolus 1,000 mL (1,000 mLs Intravenous New Bag/Given 07/14/24 1016)                                    Medical Decision Making Amount and/or Complexity of Data Reviewed Labs: ordered.  Risk Decision regarding hospitalization.   Patient with hypotension.  Differential diagnosis includes medication effect but also causes such as dehydration.  Sepsis felt less likely.  Will get basic blood work and give fluid bolus.  Patient does have bradycardia.  TSH has been checked today and is reassuring. Patient states his heart rate always does run slow.  Reviewed ER notes and psychiatry notes.  Blood pressure 4 days ago in the ER was 123/63.  Creatinine now increased up to 3.8, 4 days ago it was 1.  BUN is 36.  Could be due to the hypotension, for potential medications.  Still has some low blood pressure.  Given fluid boluses.  Sepsis felt less likely.  Will add urinalysis on.  Will also get lithium  level now with worsening kidney function.  EKG reassuring.  Will however require admission to hospital.  Will discuss with hospitalist.  CRITICAL CARE Performed by: Lot Patsey Total critical care time: 30 minutes Critical care time was exclusive of separately billable procedures and treating other patients. Critical care was necessary to treat or prevent imminent or life-threatening deterioration. Critical care was time spent personally by me on the following activities: development of treatment plan with patient and/or surrogate as well as nursing, discussions with consultants, evaluation of patient's response to treatment, examination of patient, obtaining history from patient or surrogate, ordering and performing treatments and interventions, ordering and review of laboratory studies, ordering and review of radiographic studies, pulse oximetry and re-evaluation of patient's condition.  Final diagnoses:  AKI (acute kidney injury) ([name])  Hypotension, unspecified hypotension type    ED Discharge Orders     None          Patsey Lot, MD 07/14/24 1040

## 2024-07-14 NOTE — Plan of Care (Signed)
  Problem: Clinical Measurements: Goal: Diagnostic test results will improve Outcome: Progressing   Problem: Coping: Goal: Level of anxiety will decrease Outcome: Progressing   Problem: Safety: Goal: Ability to remain free from injury will improve Outcome: Progressing   

## 2024-07-14 NOTE — ED Notes (Signed)
 Pt Belongings in 16-18 Cabinet. With pt labels

## 2024-07-14 NOTE — Plan of Care (Addendum)
 Notified that patient was transferred to ED for medical workup due to hypotension.   Patient can return to [facility] once medically stable. If admitted to medical hospital, please consult psychiatry service.

## 2024-07-14 NOTE — Progress Notes (Signed)
 PT BP low. Pt verbalized some lightheadedness, denied any other symptoms. Denies SOB, and chest pain.   Roxianne Moons NP notified. Pt assisted to lay down in bed. Provider recommended to send to ED for medical clearance per EMS.   Copy of IVC paperwork sent with patient. MHT to accompany patient to ED.   Report given to Lauraine Oas RN.     07/14/24 0631 07/14/24 0650 07/14/24 0652  Vital Signs  Temp (!) 97.1 F (36.2 C)  --   --   Temp Source Oral  --   --   Pulse Rate 62 (!) 53  --   Pulse Rate Source Monitor Monitor  --   Resp  --  (!) 21  --   BP (!) 71/37 (!) 72/48 (!) 66/37  BP Location Right Arm Right Arm Left Arm  BP Method Automatic Automatic Automatic  Patient Position (if appropriate) Sitting Lying Lying  Oxygen Therapy  SpO2 100 %  --   --     07/14/24 0706  Vital Signs  Temp  --   Temp Source  --   Pulse Rate  --   Pulse Rate Source  --   Resp  --   BP (!) 74/43  BP Location Right Arm  BP Method Automatic  Patient Position (if appropriate) Lying  Oxygen Therapy  SpO2  --

## 2024-07-15 DIAGNOSIS — F29 Unspecified psychosis not due to a substance or known physiological condition: Secondary | ICD-10-CM | POA: Diagnosis not present

## 2024-07-15 DIAGNOSIS — F308 Other manic episodes: Secondary | ICD-10-CM

## 2024-07-15 DIAGNOSIS — N179 Acute kidney failure, unspecified: Secondary | ICD-10-CM | POA: Diagnosis not present

## 2024-07-15 LAB — GLUCOSE, CAPILLARY
Glucose-Capillary: 90 mg/dL (ref 70–99)
Glucose-Capillary: 85 mg/dL (ref 70–99)

## 2024-07-15 LAB — COMPREHENSIVE METABOLIC PANEL WITH GFR
ALT: 18 U/L (ref 0–44)
AST: 22 U/L (ref 15–41)
Albumin: 3.8 g/dL (ref 3.5–5.0)
Alkaline Phosphatase: 60 U/L (ref 38–126)
Anion gap: 12 (ref 5–15)
BUN: 21 mg/dL — ABNORMAL HIGH (ref 6–20)
CO2: 21 mmol/L — ABNORMAL LOW (ref 22–32)
Calcium: 9.1 mg/dL (ref 8.9–10.3)
Chloride: 108 mmol/L (ref 98–111)
Creatinine, Ser: 1.11 mg/dL (ref 0.61–1.24)
GFR, Estimated: >60 mL/min (ref >60)
Glucose, Bld: 92 mg/dL (ref 70–99)
Potassium: 4.3 mmol/L (ref 3.5–5.1)
Sodium: 141 mmol/L (ref 135–145)
Total Bilirubin: 0.4 mg/dL (ref 0.0–1.2)
Total Protein: 6.3 g/dL — ABNORMAL LOW (ref 6.5–8.1)

## 2024-07-15 LAB — BASIC METABOLIC PANEL WITH GFR
Anion gap: 10 (ref 5–15)
BUN: 20 mg/dL (ref 6–20)
CO2: 24 mmol/L (ref 22–32)
Calcium: 9.4 mg/dL (ref 8.9–10.3)
Chloride: 104 mmol/L (ref 98–111)
Creatinine, Ser: 1.02 mg/dL (ref 0.61–1.24)
GFR, Estimated: >60 mL/min (ref >60)
Glucose, Bld: 89 mg/dL (ref 70–99)
Potassium: 4.6 mmol/L (ref 3.5–5.1)
Sodium: 138 mmol/L (ref 135–145)

## 2024-07-15 LAB — CBC
HCT: 39.2 % (ref 39.0–52.0)
Hemoglobin: 12.5 g/dL — ABNORMAL LOW (ref 13.0–17.0)
MCH: 30.3 pg (ref 26.0–34.0)
MCHC: 31.9 g/dL (ref 30.0–36.0)
MCV: 94.9 fL (ref 80.0–100.0)
Platelets: 183 K/uL (ref 150–400)
RBC: 4.13 MIL/uL — ABNORMAL LOW (ref 4.22–5.81)
RDW: 13.8 % (ref 11.5–15.5)
WBC: 8.3 K/uL (ref 4.0–10.5)
nRBC: 0 % (ref 0.0–0.2)

## 2024-07-15 LAB — HEMOGLOBIN A1C
Hgb A1c MFr Bld: 6.8 % — ABNORMAL HIGH (ref 4.8–5.6)
Mean Plasma Glucose: 148.46 mg/dL

## 2024-07-15 LAB — CK: Total CK: 270 U/L (ref 49–397)

## 2024-07-15 LAB — HIV ANTIBODY (ROUTINE TESTING W REFLEX): HIV Screen 4th Generation wRfx: NONREACTIVE

## 2024-07-15 MED ORDER — SIMVASTATIN 40 MG PO TABS: 40.0000 mg | ORAL_TABLET | Freq: Every day | ORAL | Status: DC

## 2024-07-15 MED ORDER — ENOXAPARIN SODIUM 40 MG/0.4ML IJ SOSY
40.0000 mg | PREFILLED_SYRINGE | INTRAMUSCULAR | Status: DC
Start: 2024-07-15 — End: 2024-07-15

## 2024-07-15 MED ORDER — ASPIRIN 81 MG PO TBEC
81.0000 mg | DELAYED_RELEASE_TABLET | Freq: Every day | ORAL | Status: DC
  Administered 2024-07-15: 81 mg via ORAL
  Filled 2024-07-15: qty 1

## 2024-07-15 MED ORDER — AMLODIPINE BESYLATE 5 MG PO TABS: 5.0000 mg | ORAL_TABLET | Freq: Every day | ORAL | 1 refills | Status: DC

## 2024-07-15 NOTE — Discharge Summary (Signed)
 [facility]  Physician Discharge Summary   Patient ID: Austin Smith MRN: [id number] DOB/AGE: 01/06/1966 58 y.o.  Admit date: 07/14/2024 Discharge date: 07/15/2024    PCP: Gladis Mustard, FNP  DISCHARGE DIAGNOSES:    AKI (acute kidney injury) ([name])   Hyperlipidemia associated with type 2 diabetes mellitus ([name])   Type 2 diabetes mellitus without complication, without long-term current use of insulin  ([name])   Hypertension associated with type 2 diabetes mellitus ([name])   Severe manic bipolar 1 disorder with psychotic behavior ([name])   RECOMMENDATIONS FOR OUTPATIENT FOLLOW UP: Follow-up with primary care provider   Home Health: None Equipment/Devices: None  CODE STATUS: Full code  DISCHARGE CONDITION: fair  Diet recommendation: As before  INITIAL HISTORY: 58 y.o. male with medical history significant of class I obesity, prediabetes, hyperlipidemia, hypertension, bipolar 1 disorder with psychotic behavior who was seen 4 days prior to this admission at [facility] emergency department with hallucinations, paranoia, delusions of grandeur, and was admitted to behavioral health under IVC on 07/12/2024 for treatment.  He was sent today to the emergency department due to acute kidney injury.  He stated he had 3 episodes of diarrhea while at the behavioral health hospital.  He is on lisinopril  20 mg and hydrochlorothiazide  25 mg p.o. daily for hypertension.  He has felt lightheaded, but no chest pain, palpitations, diaphoresis, PND, orthopnea or pitting edema of the lower extremities.  Found to have acute kidney injury and was hospitalized for further management.  Was given IV fluids.     Consultants: Psychiatry   Procedures: None    HOSPITAL COURSE:   Acute kidney injury Most likely due to hypovolemia from GI symptoms made worse by the fact that he was on ACE inhibitor and HCTZ.  These medications were held.  Patient was given IV fluids. Renal ultrasound did not show any acute  findings. Renal function has improved.  Creatinine is back to baseline.   Severe manic bipolar 1 disorder with psychotic behavior Was admitted at behavioral health and had to be transferred to acute inpatient setting due to AKI. Inpatient psychiatry has been consulted.  Patient is noted to be on lithium  and Risperdal . IVC was rescinded by psychiatry.  They recommend not continuing lithium  and Risperdal  at discharge.  They will arrange outpatient follow-up.   Diabetes mellitus type 2 HbA1c 6.8. Not noted to be on any diabetic medications prior to admission.   Hyperlipidemia Continue statin.     Essential hypertension Holding his ACE inhibitor and HCTZ.  Told him that this may not be appropriate medication for him considering his lifestyle and his work situation.  He was transitioned to amlodipine .     Obesity Estimated body mass index is 31.88 kg/m as calculated from the following:   Height as of this encounter: 6' 1 (1.854 m).   Weight as of this encounter: 109.6 kg.   Patient is stable.  Cleared by psychiatry.  Okay for discharge home today.   PERTINENT LABS:  The results of significant diagnostics from this hospitalization (including imaging, microbiology, ancillary and laboratory) are listed below for reference.     Labs:   Basic Metabolic Panel: Recent Labs  Lab 07/10/24 2004 07/14/24 0809 07/15/24 0458 07/15/24 1224  NA 133* 135 141 138  K 3.6 4.0 4.3 4.6  CL 97* 95* 108 104  CO2 22 24 21* 24  GLUCOSE 98 102* 92 89  BUN 24* 36* 21* 20  CREATININE 1.05 3.86* 1.11 1.02  CALCIUM 9.3 10.0 9.1 9.4  Liver Function Tests: Recent Labs  Lab 07/10/24 2004 07/15/24 0458  AST 31 22  ALT 23 18  ALKPHOS 58 60  BILITOT 1.0 0.4  PROT 7.4 6.3*  ALBUMIN 4.3 3.8   CBC: Recent Labs  Lab 07/10/24 2004 07/14/24 0809 07/15/24 0458  WBC 14.3* 12.2* 8.3  HGB 14.7 15.1 12.5*  HCT 42.6 45.7 39.2  MCV 90.3 93.6 94.9  PLT 264 219 183   Cardiac Enzymes: Recent  Labs  Lab 07/14/24 0809 07/15/24 1224  CKTOTAL 541* 270     CBG: Recent Labs  Lab 07/10/24 2246 07/14/24 1618 07/14/24 2040 07/15/24 0739 07/15/24 1223  GLUCAP 102* 96 101* 90 85     IMAGING STUDIES US  RENAL Result Date: 07/14/2024 CLINICAL DATA:  Acute kidney injury. EXAM: RENAL / URINARY TRACT ULTRASOUND COMPLETE COMPARISON:  None Available. FINDINGS: Right Kidney: Renal measurements: 13.3 cm x 5.4 cm x 6.4 cm = volume: 237.5 mL. Echogenicity within normal limits. No mass or hydronephrosis visualized. Left Kidney: Renal measurements: 13.7 cm x 6.6 cm x 6.0 cm = volume: 284.9 mL. Echogenicity within normal limits. No mass or hydronephrosis visualized. Bladder: Appears normal for degree of bladder distention. Other: None. IMPRESSION: Unremarkable renal ultrasound. Electronically Signed   By: Suzen Dials M.D.   On: 07/14/2024 16:56    DISCHARGE EXAMINATION: See progress note from earlier today  DISPOSITION: Home  Discharge Instructions     Call MD for:  difficulty breathing, headache or visual disturbances   Complete by: As directed    Call MD for:  extreme fatigue   Complete by: As directed    Call MD for:  hives   Complete by: As directed    Call MD for:  persistant dizziness or light-headedness   Complete by: As directed    Call MD for:  persistant nausea and vomiting   Complete by: As directed    Call MD for:  severe uncontrolled pain   Complete by: As directed    Call MD for:  temperature >100.4   Complete by: As directed    Diet - low sodium heart healthy   Complete by: As directed    Discharge instructions   Complete by: As directed    Please be sure to follow up with your PCP within 1 week. Check your BP at home and start the Amlodipine  only if the BP is greater than 140/80. The psychiatrist has recommended that you do not take the Lithium  or Risperdal  for now. They will arrange outpatient follow up.  You were cared for by a hospitalist during your  hospital stay. If you have any questions about your discharge medications or the care you received while you were in the hospital after you are discharged, you can call the unit and asked to speak with the hospitalist on call if the hospitalist that took care of you is not available. Once you are discharged, your primary care physician will handle any further medical issues. Please note that NO REFILLS for any discharge medications will be authorized once you are discharged, as it is imperative that you return to your primary care physician (or establish a relationship with a primary care physician if you do not have one) for your aftercare needs so that they can reassess your need for medications and monitor your lab values. If you do not have a primary care physician, you can call [phone] for a physician referral.   Increase activity slowly   Complete by: As directed  Allergies as of 07/15/2024   No Known Allergies      Medication List     STOP taking these medications    ibuprofen  200 MG tablet Commonly known as: ADVIL    lisinopril -hydrochlorothiazide  20-25 MG tablet Commonly known as: ZESTORETIC    lithium  carbonate 300 MG ER tablet Commonly known as: LITHOBID    risperiDONE  2 MG tablet Commonly known as: RISPERDAL        TAKE these medications    amLODipine  5 MG tablet Commonly known as: NORVASC  Take 1 tablet (5 mg total) by mouth daily.   aspirin  EC 81 MG tablet Take 81 mg by mouth daily. Swallow whole.   GOODY HEADACHE PO Take 1 Package by mouth as needed (headache).   HYDROcodone  bit-homatropine 5-1.5 MG/5ML syrup Commonly known as: HYCODAN Take 5 mLs by mouth every 6 (six) hours as needed for cough.   simvastatin  40 MG tablet Commonly known as: ZOCOR  TAKE ONE (1) TABLET EACH DAY What changed:  how much to take how to take this when to take this          Follow-up Information     Gladis Mustard, FNP. Schedule an appointment as soon  as possible for a visit in 1 week(s).   Specialty: Family Medicine Why: post hospitalization follow up Contact information: [address] [place] [address] [phone]                 TOTAL DISCHARGE TIME: 35 minutes  Lalia Loudon Verdene  [facility] Pager on [email or web address]  07/16/2024, 9:46 AM

## 2024-07-15 NOTE — Progress Notes (Signed)
 [facility] PROGRESS NOTE   Austin Smith FMW:[id number] DOB: 02-14-1966 DOA: 07/14/2024  PCP: Gladis Mustard, FNP  Brief History: 58 y.o. male with medical history significant of class I obesity, prediabetes, hyperlipidemia, hypertension, bipolar 1 disorder with psychotic behavior who was seen 4 days prior to this admission at [facility] emergency department with hallucinations, paranoia, delusions of grandeur, and was admitted to behavioral health under IVC on 07/12/2024 for treatment.  He was sent today to the emergency department due to acute kidney injury.  He stated he had 3 episodes of diarrhea while at the behavioral health hospital.  He is on lisinopril  20 mg and hydrochlorothiazide  25 mg p.o. daily for hypertension.  He has felt lightheaded, but no chest pain, palpitations, diaphoresis, PND, orthopnea or pitting edema of the lower extremities.  Found to have acute kidney injury and was hospitalized for further management.  Was given IV fluids.    Consultants: Psychiatry  Procedures: None    Subjective/Interval History: Patient feels much better this morning.  Denies any nausea vomiting or diarrhea.  He is a [employer] and does not keep himself hydrated.  He was on ACE inhibitor HCTZ.    Assessment/Plan:  Acute kidney injury Most likely due to hypovolemia from GI symptoms made worse by the fact that he was on ACE inhibitor and HCTZ.  These medications were held.  Patient was given IV fluids. Renal ultrasound did not show any acute findings. Renal function has improved this morning.  Creatinine is down to 1.11.  Will check it again at noon. Monitor urine output.  Avoid nephrotoxic agents.  Severe manic bipolar 1 disorder with psychotic behavior Was admitted at behavioral health and had to be transferred to acute inpatient setting due to AKI. Inpatient psychiatry has been consulted.  Patient is noted to be on lithium  and Risperdal . It appears that he is involuntarily  committed.  He has a Comptroller. Defer management to psychiatry. If his repeat basic metabolic panel at noon shows stability in renal function then he will be considered medically stable.  Diabetes mellitus type 2 HbA1c 6.8. Monitor CBGs SSI.  Not noted to be on any diabetic medications prior to admission.  Hyperlipidemia Continue statin.  CK level was only minimally elevated.  Will recheck.  Essential hypertension Holding his ACE inhibitor and HCTZ.  Told him that this may not be appropriate medication for him considering his lifestyle and his work situation.  May need to consider alternatives such as amlodipine .  Currently blood pressure is reasonably well-controlled.  This can be deferred to his outpatient providers.  Obesity Estimated body mass index is 31.88 kg/m as calculated from the following:   Height as of this encounter: 6' 1 (1.854 m).   Weight as of this encounter: 109.6 kg.   DVT Prophylaxis: Lovenox  Code Status: Full code Family Communication: Discussed with patient Disposition Plan: To be determined  Status is: Observation The patient remains OBS appropriate and will d/c before 2 midnights.      Medications: Scheduled:  aspirin  EC  81 mg Oral Daily   enoxaparin  (LOVENOX ) injection  30 mg Subcutaneous Q24H   insulin  aspart  0-15 Units Subcutaneous TID WC   lithium  carbonate  300 mg Oral Q12H   risperiDONE   2 mg Oral QHS   [START ON 07/16/2024] simvastatin   40 mg Oral Daily   Continuous: PRN:acetaminophen  **OR** acetaminophen , ondansetron  **OR** ondansetron  (ZOFRAN ) IV    Objective:  Vital Signs  Vitals:   07/14/24 1235 07/14/24 1349 07/14/24 2010  07/15/24 0714  BP: 122/74  119/72 122/66  Pulse: 65  72 (!) 50  Resp: 18  (!) 21 14  Temp: 98 F (36.7 C)  98.5 F (36.9 C) 98.3 F (36.8 C)  TempSrc: Oral   Oral  SpO2:   98% 100%  Weight:  109.6 kg    Height:  6' 1 (1.854 m)      Intake/Output Summary (Last 24 hours) at 07/15/2024 0957 Last data  filed at 07/15/2024 0825 Gross per 24 hour  Intake 1166.86 ml  Output --  Net 1166.86 ml   Filed Weights   07/14/24 1349  Weight: 109.6 kg    General appearance: Awake alert.  In no distress Resp: Clear to auscultation bilaterally.  Normal effort Cardio: S1-S2 is normal regular.  No S3-S4.  No rubs murmurs or bruit GI: Abdomen is soft.  Nontender nondistended.  Bowel sounds are present normal.  No masses organomegaly Extremities: No edema.  Full range of motion of lower extremities. Neurologic: Alert and oriented x3.  No focal neurological deficits.    Lab Results:  Data Reviewed: I have personally reviewed following labs and reports of the imaging studies  CBC: Recent Labs  Lab 07/10/24 2004 07/14/24 0809 07/15/24 0458  WBC 14.3* 12.2* 8.3  HGB 14.7 15.1 12.5*  HCT 42.6 45.7 39.2  MCV 90.3 93.6 94.9  PLT 264 219 183    Basic Metabolic Panel: Recent Labs  Lab 07/10/24 2004 07/14/24 0809 07/15/24 0458  NA 133* 135 141  K 3.6 4.0 4.3  CL 97* 95* 108  CO2 22 24 21*  GLUCOSE 98 102* 92  BUN 24* 36* 21*  CREATININE 1.05 3.86* 1.11  CALCIUM 9.3 10.0 9.1    GFR: Estimated Creatinine Clearance: 95.3 mL/min (by C-G formula based on SCr of 1.11 mg/dL).  Liver Function Tests: Recent Labs  Lab 07/10/24 2004 07/15/24 0458  AST 31 22  ALT 23 18  ALKPHOS 58 60  BILITOT 1.0 0.4  PROT 7.4 6.3*  ALBUMIN 4.3 3.8     Cardiac Enzymes: Recent Labs  Lab 07/14/24 0809  CKTOTAL 541*   HbA1C: Recent Labs    07/15/24 0458  HGBA1C 6.8*    CBG: Recent Labs  Lab 07/10/24 2246 07/14/24 1618 07/14/24 2040 07/15/24 0739  GLUCAP 102* 96 101* 90    Thyroid Function Tests: Recent Labs    07/14/24 0624  TSH 3.380    Radiology Studies: US  RENAL Result Date: 07/14/2024 CLINICAL DATA:  Acute kidney injury. EXAM: RENAL / URINARY TRACT ULTRASOUND COMPLETE COMPARISON:  None Available. FINDINGS: Right Kidney: Renal measurements: 13.3 cm x 5.4 cm x 6.4 cm =  volume: 237.5 mL. Echogenicity within normal limits. No mass or hydronephrosis visualized. Left Kidney: Renal measurements: 13.7 cm x 6.6 cm x 6.0 cm = volume: 284.9 mL. Echogenicity within normal limits. No mass or hydronephrosis visualized. Bladder: Appears normal for degree of bladder distention. Other: None. IMPRESSION: Unremarkable renal ultrasound. Electronically Signed   By: Suzen Dials M.D.   On: 07/14/2024 16:56       LOS: 0 days   Ulrick Methot Verdene  [facility] Pager on [email or web address]  07/15/2024, 9:57 AM

## 2024-07-15 NOTE — TOC Transition Note (Signed)
 Transition of Care [facility]) - Discharge Note   Patient Details  Name: Austin Smith MRN: [id number] Date of Birth: 06-Jan-1966  [facility] CM/SW Contact:  Doneta Glenys DASEN, RN Phone Number: 07/15/2024, 3:13 PM   Clinical Narrative:    CM filed Notice of Change in Commitment Recommendation with [facility] and uploaded to [employer]. CM scheduled follow-up hospital PCP appointment and information is on AVS.  Patient Goals and CMS Choice            Discharge Placement                       Discharge Plan and Services Additional resources added to the After Visit Summary for                                       Social Drivers of Health (SDOH) Interventions SDOH Screenings   Food Insecurity: No Food Insecurity (07/14/2024)  Housing: Low Risk  (07/14/2024)  Transportation Needs: No Transportation Needs (07/14/2024)  Utilities: Not At Risk (07/14/2024)  Alcohol Screen: Low Risk  (07/11/2024)  Depression (PHQ2-9): Low Risk  (06/24/2024)  Financial Resource Strain: Low Risk  (04/05/2024)  Physical Activity: Insufficiently Active (04/05/2024)  Social Connections: Socially Integrated (04/05/2024)  Stress: No Stress Concern Present (04/05/2024)  Tobacco Use: Low Risk  (07/14/2024)     Readmission Risk Interventions     No data to display

## 2024-07-15 NOTE — Consult Note (Signed)
 [facility] Psychiatric Consult Initial  Patient Name: .Austin Smith  MRN: [id number]  DOB: [birth year]  Consult Order details:  Orders (From admission, onward)     Start     Ordered   07/14/24 1308  IP CONSULT TO PSYCHIATRY       Comments: Pt was at [facility]. Transferred to ED given hypotension and admitted.  Ordering Provider: Cornelius Dines, MD  Provider:  (Not yet assigned)  Question Answer Comment  Location [facility]   Reason for Consult? Bipolar 1 disorder.      07/14/24 1307             Mode of Visit: In person    Psychiatry Consult Evaluation  Service Date: July 15, 2024 LOS:  LOS: 0 days  Chief Complaint From [facility] for mania.   Primary Psychiatric Diagnoses  Medication induced mania 2.  Brief psychotic disorder 3.    Assessment  Austin Smith is a 58 y.o. male admitted: Medicallyfor 07/14/2024  1:06 PM for hypotension and dehydration. He carries the psychiatric diagnoses of none and has a past medical history of  hypertension2   On exam, the patient is casually groomed, cooperative, and able to engage in a reciprocal conversation. He was observed standing independently and shaving at the sink. His speech is clear and no longer pressured. His mood is reported as improved, with affect congruent and appropriate. His thought process is linear, logical, and coherent, with no evidence of flight of ideas. His thought content is free of current delusions or hallucinations, though he can reflect on his prior manic and psychotic-like behaviors. He is alert and oriented 4, with concentration and attention span intact and memory appropriate. Insight and judgment appear improved. Past medical history is significant for hypertension, with recent history notable for prednisone  use and hydrocodone , as well as a urinary tract infection and electrolyte derangements that have since been treated. Vital signs are stable. Collateral from his wife supports continued clinical  improvement, and she states she is comfortable with him returning home while emphasizing avoidance of prednisone  in the future.  His current presentation of improved mood stability, organized thought process, and absence of psychotic symptoms is most consistent with resolution of a manic/psychotic-like episode likely secondary to prednisone  use, UTI, and metabolic derangements. He meets criteria for a medication-induced manic episode based on the temporal relationship between prednisone  administration and the acute onset of mania-like symptoms in a patient with no prior psychiatric history, with subsequent resolution following treatment of the infection, correction of electrolytes, and discontinuation of prednisone .  Current outpatient psychotropic medications include none at this time, and historically he has had a no documented prior psychiatric medication exposure or treatment response given the absence of previous psychiatric history. He was not previously on psychiatric medications prior to admission as evidenced by review of medical records and collateral from family denying any prior psychiatric treatment or prescriptions.  On initial examination, patient was very well organized, coherent and linear thought processes, with normal speech and improved insight.  Please see plan below for detailed recommendations.   Diagnoses:  Active Hospital problems: Principal Problem:   AKI (acute kidney injury) ([name]) Active Problems:   Hyperlipidemia associated with type 2 diabetes mellitus ([name])   Type 2 diabetes mellitus without complication, without long-term current use of insulin  ([name])   Hypertension associated with type 2 diabetes mellitus ([name])   Severe manic bipolar 1 disorder with psychotic behavior ([name])    Plan   ## Psychiatric Medication Recommendations:  DC psych medications at this time  ## Medical Decision Making Capacity: Not specifically addressed in this encounter  ## Further  Work-up:  -- CK 521, UA +small leukocytes TSH, B12, folate, EKG, While pt on Qtc prolonging medications, please monitor & replete K+ to 4 and Mg2+ to 2, TOC consult for substance abuse resources, U/A, or UDS -- most recent EKG on 09/07 had QtC of 489 -- Pertinent labwork reviewed earlier this admission includes: defer to primary team   ## Disposition:-- There are no psychiatric contraindications to discharge at this time  ## Behavioral / Environmental: - No specific recommendations at this time.     ## Safety and Observation Level:  - Based on my clinical evaluation, I estimate the patient to be at Low risk of self harm in the current setting. - At this time, we recommend  routine. This decision is based on my review of the chart including patient's history and current presentation, interview of the patient, mental status examination, and consideration of suicide risk including evaluating suicidal ideation, plan, intent, suicidal or self-harm behaviors, risk factors, and protective factors. This judgment is based on our ability to directly address suicide risk, implement suicide prevention strategies, and develop a safety plan while the patient is in the clinical setting. Please contact our team if there is a concern that risk level has changed.  CSSR Risk Category:C-SSRS RISK CATEGORY: No Risk  Suicide Risk Assessment: Patient has following modifiable risk factors for suicide: recent psychiatric hospitalization and pain, medical illness (ie new dx of cancer), which we are addressing by psychoeducation and discontinuation of triggering medications. Patient has following non-modifiable or demographic risk factors for suicide: male gender Patient has the following protective factors against suicide: Access to outpatient mental health care, Supportive family, Supportive friends, Cultural, spiritual, or religious beliefs that discourage suicide, Frustration tolerance, no history of suicide attempts,  and no history of NSSIB  Thank you for this consult request. Recommendations have been communicated to the primary team.  We will sign off at this time.   Majel GORMAN Ramp, FNP       History of Present Illness  Relevant Aspects of [facility] Course:  Admitted on 07/14/2024 for admission to a medical hospital for stabilization, IV fluid resuscitation, EKG and laboratory monitoring, with electrolyte correction, treatment of urinary tract infection, and management of elevated CK. Patient has since stabilized, CK levels have returned to baseline, and he will likely discharge home today with family support.  Patient Report:  I feel a lot better today. I don't know what happened last week, but I remember what I did. I've been sleeping and eating more, and I feel more like myself again.  Today, the patient reports feeling "better." He is oriented 4 and recalls his actions from the past week, though he expresses uncertainty about what led to them. He denies suicidal or homicidal ideation, hallucinations, or substance use. He reports improved sleep, appetite, and mood. His wife confirms significant improvement, stating he "looks and feels much better" compared to last week and has no safety concerns regarding discharge home. She is primarily concerned about potential recurrence if prednisone  is prescribed in the future.  Psych ROS:  Depression: Denies Anxiety:  Denies Mania (lifetime and current): Initially presented with mania, however denies  Psychosis: (lifetime and current): currently resolves  Collateral information:  Collateral information obtained from the wife and mother-in-law at bedside indicates that his symptoms have improved tremendously, and they are no longer concerned or worried.  His wife reports that his psychiatric status has improved daily, and she feels comfortable taking him home. She confirmed that guns in the home have been secured. She strongly believes the episode  was triggered by medication in the context of poor sleep and dehydration. 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 They verbalized understanding of the need for ongoing attention to sleep hygiene, adequate fluid intake, and balanced nutrition.  ROS   Psychiatric and Social History  Psychiatric History:  Information collected from patient, medical team, chart review and wife.   Prev Dx/Sx: Denies  Social History:  Developmental Hx: WNL Educational Hx: High school Occupational Hx: Truck Industrial/product designer Hx: Denies Living Situation: Lives with wife Spiritual Hx: [place] Access to weapons/lethal means: Yes have been secured, wife confirms   Substance History Denies  Exam Findings  Physical Exam: Age appropriate Vital Signs:  Temp:  [98.3 F (36.8 C)-98.5 F (36.9 C)] 98.3 F (36.8 C) (09/08 0714) Pulse Rate:  [50-72] 50 (09/08 0714) Resp:  [14-21] 14 (09/08 0714) BP: (119-122)/(66-72) 122/66 (09/08 0714) SpO2:  [98 %-100 %] 100 % (09/08 0714) Blood pressure 122/66, pulse (!) 50, temperature 98.3 F (36.8 C), temperature source Oral, resp. rate 14, height 6' 1 (1.854 m), weight 109.6 kg, SpO2 100%. Body mass index is 31.88 kg/m.  Physical Exam  Mental Status Exam: General Appearance: Fairly Groomed  Orientation:  Full (Time, Place, and Person)  Memory:  Immediate;   Good Recent;   Good  Concentration:  Concentration: Good and Attention Span: Good  Recall:  Good  Attention  Good  Eye Contact:  Good  Speech:  Clear and Coherent and Normal Rate  Language:  Good  Volume:  Normal  Mood: much better  Affect:  Appropriate and Congruent  Thought Process:  Coherent and Linear  Thought Content:  WDL  Suicidal Thoughts:  No  Homicidal Thoughts:  No  Judgement:  Good  Insight:  Present  Psychomotor Activity:  Normal   Akathisia:  No  Fund of Knowledge:  Good      Assets:  Communication Skills Desire for Improvement Financial Resources/Insurance Housing Intimacy Leisure Time Physical Health Resilience Social Support  Cognition:  WNL  ADL's:  Intact  AIMS (if indicated):        Other History   These have been pulled in through the EMR, reviewed, and updated if appropriate.  Family History:  The patient's family history includes Alzheimer's disease in his paternal grandfather; Cancer in his maternal grandmother and paternal aunt; Early death (age of onset: 55) in his paternal grandmother; Heart disease in his mother; Kidney Stones in his brother; Thyroid disease in his brother.  Medical History: Past Medical History:  Diagnosis Date   Borderline diabetic    Hyperlipidemia    Hypertension     Surgical History: History reviewed. No pertinent surgical history.   Medications:   Current Facility-Administered Medications:    acetaminophen  (TYLENOL ) tablet 650 mg, 650 mg, Oral, Q6H PRN **OR** acetaminophen  (TYLENOL ) suppository 650 mg, 650 mg, Rectal, Q6H PRN, Celinda Alm Lot, MD   aspirin  EC tablet 81 mg, 81 mg, Oral, Daily, Krishnan, Gokul, MD, 81 mg at 07/15/24 1400   enoxaparin  (LOVENOX ) injection 30 mg, 30 mg, Subcutaneous, Q24H, Celinda Alm Lot, MD, 30 mg at 07/14/24 2119   insulin  aspart (novoLOG ) injection 0-15 Units, 0-15 Units, Subcutaneous,  TID WC, Celinda Alm Lot, MD   lithium  carbonate (LITHOBID ) ER tablet 300 mg, 300 mg, Oral, Q12H, McCarty, Artie, MD, 300 mg at 07/15/24 0932   ondansetron  (ZOFRAN ) tablet 4 mg, 4 mg, Oral, Q6H PRN **OR** ondansetron  (ZOFRAN ) injection 4 mg, 4 mg, Intravenous, Q6H PRN, Celinda Alm Lot, MD   risperiDONE  (RISPERDAL ) tablet 2 mg, 2 mg, Oral, QHS, McCarty, Artie, MD, 2 mg at 07/14/24 2119   [START ON 07/16/2024] simvastatin  (ZOCOR ) tablet 40 mg, 40 mg, Oral, Daily, Krishnan, Gokul, MD  Allergies: No Known Allergies  Majel GORMAN Ramp, FNP

## 2024-07-15 NOTE — Plan of Care (Signed)

## 2024-07-16 ENCOUNTER — Ambulatory Visit

## 2024-07-16 ENCOUNTER — Encounter: Payer: Self-pay | Admitting: Family

## 2024-07-16 ENCOUNTER — Inpatient Hospital Stay: Admitting: Family Medicine

## 2024-07-16 VITALS — BP 125/76 | HR 43 | Temp 97.4°F | Ht 73.0 in | Wt 241.0 lb

## 2024-07-16 DIAGNOSIS — R051 Acute cough: Secondary | ICD-10-CM | POA: Diagnosis not present

## 2024-07-16 DIAGNOSIS — Z09 Encounter for follow-up examination after completed treatment for conditions other than malignant neoplasm: Secondary | ICD-10-CM | POA: Diagnosis not present

## 2024-07-16 DIAGNOSIS — E1159 Type 2 diabetes mellitus with other circulatory complications: Secondary | ICD-10-CM | POA: Diagnosis not present

## 2024-07-16 DIAGNOSIS — N179 Acute kidney failure, unspecified: Secondary | ICD-10-CM

## 2024-07-16 DIAGNOSIS — I152 Hypertension secondary to endocrine disorders: Secondary | ICD-10-CM

## 2024-07-16 LAB — PROLACTIN: Prolactin: 152 ng/mL — ABNORMAL HIGH (ref 3.6–25.2)

## 2024-07-16 MED ORDER — BENZONATATE 200 MG PO CAPS: 200.0000 mg | ORAL_CAPSULE | Freq: Three times a day (TID) | ORAL | 1 refills | Status: DC | PRN

## 2024-07-16 NOTE — Patient Instructions (Signed)
 Acute Kidney Injury, Adult Acute kidney injury is a sudden decrease in the ability of the kidneys to do what they are supposed to do. The kidneys are a pair of organs that: Make urine. Make hormones. Keep the right amount of fluids and chemicals in the body. This condition ranges from mild to severe. Over time, it may turn into long-term (chronic) kidney disease. Finding and treating the injury early may keep it from turning into chronic kidney disease. What are the causes? Common causes of this condition include: A problem with blood flow to the kidneys. This may be caused by: Low blood pressure, shock, or severe dehydration. Severe blood loss. Heart and blood vessel disease. Severe burns. Liver disease. Direct damage to the kidneys. This may be caused by: Certain medicines or toxins. Kidney disease. Contrast dye used in imaging tests. An infection of the kidney or bloodstream. Problems from surgery. Trauma to the kidney area. Organ failure. This includes heart or liver failure. A sudden block in urine flow. This may be caused by: Cancer. Kidney stones. An enlarged prostate. What increases the risk? You may be more likely to develop this condition if: You are older than 58 years of age. You are male. You are in the hospital. You may be even more at risk if you are very sick. You have certain conditions. These may include: Chronic kidney or liver disease. Diabetes. Heart disease and heart failure. Lung disease. What are the signs or symptoms? This condition may not cause symptoms until it becomes severe. If it does, symptoms may include: Feeling very tired or having trouble staying awake. Nausea or vomiting. Swelling (edema) of the face, legs, ankles, or feet. Pain in your abdomen, back, or along the side of your back (flank). Urine changes. You may: Make little or no urine. Pass urine with a weak flow. Muscle twitches and cramps. These are most often in the  legs. Confusion or trouble focusing. Not feeling the urge to eat. Fever. How is this diagnosed? This condition may be diagnosed based on your symptoms and your medical history. You may have a physical exam done. You may also have tests, such as: Blood tests. Urine tests. Imaging tests. A kidney biopsy. This is when a sample of kidney tissue is removed and looked at under a microscope. How is this treated? Treatment depends on the cause and how severe the condition is. In mild cases, treatment may not be needed. The kidneys may heal on their own. In severe cases, treatment may include: Treating the cause of the kidney injury. This may mean that you have to change your medicines or the doses you take. Getting fluids through an IV tube. Having a flexible tube (catheter) put in. This tube will drain urine and prevent blockages. Trying to keep problems from starting. This may mean not using certain medicines or not having tests done that could cause more injury. In some cases, you may also need: Dialysis or continuous renal replacement therapy (CRRT). This treatment uses a machine to do the job of the kidneys. Surgery. This may be done to repair a damaged kidney. It could also be done to remove a blockage in the urinary tract. Follow these instructions at home: Medicines Take over-the-counter and prescription medicines only as told by your health care provider. Do not take new medicines unless approved by your health care provider. Many medicines can make kidney damage worse. Do not take vitamin or mineral supplements unless approved by your health care provider. Some of  these can make kidney damage worse. Lifestyle  Make changes to your diet as told by your health care provider. You may need to eat less protein. Get to, and stay at, a healthy weight. If you need help, ask your health care provider. Start or keep up an exercise plan. Exercise at least 30 minutes a day, 5 days a week. Do not  use any products that contain nicotine or tobacco. These products include cigarettes, chewing tobacco, and vaping devices, such as e-cigarettes. If you need help quitting, ask your health care provider. General instructions  Keep track of your blood pressure. Tell your health care provider if you notice any changes. Keep your vaccines up to date. Ask your health care provider which vaccines you need. Keep all follow-up visits. Your health care provider will need to monitor your kidneys. Where to find support American Association of Kidney Patients: https://www.miller-montoya.com/ American Kidney Fund: [place].com.au Where to find more information [employer]: kidney.org Medical Education Institute: LifeOptions: lifeoptions.org Kidney School: kidneyschool.org Contact a health care provider if: Your symptoms get worse. You have new symptoms, such as: Headaches. Skin that is darker or lighter than normal. Easy bruising. Feeling itchy. Hiccups. Lack of menstrual periods. You have a fever. Get help right away if: You have signs of severe kidney disease, such as: Chest pain. Shortness of breath. Seizures. You have pain or bleeding when you pass urine. You make little or no urine. These symptoms may be an emergency. Get help right away. Call 911. Do not wait to see if the symptoms will go away. Do not drive yourself to the hospital. This information is not intended to replace advice given to you by your health care provider. Make sure you discuss any questions you have with your health care provider. Document Revised: 05/13/2022 Document Reviewed: 05/13/2022 Elsevier Patient Education  2024 [employer].

## 2024-07-16 NOTE — Progress Notes (Signed)
 Subjective:    Patient ID: Austin Smith, male    DOB: 09-16-1966, 58 y.o.   MRN: [id number]  Chief Complaint  Patient presents with   Hospitalization Follow-up   Pt presents to the office today with hospital follow up. He went to the ED on 07/10/24 for changes in mental status, fatigue.   He was diagnosed with AKI, hypotension. Was given hydration and his creatinine function back at baseline before discharge.   His Lisinopril  and hydrochlorothiazide  was stopped and started on Norvasc  5 mg.   They thought his hallucinations may be related to his hycodan syrup. Continues to have a dry cough.  Hypertension This is a chronic problem. The current episode started more than 1 year ago. The problem has been resolved since onset. The problem is controlled. Pertinent negatives include no malaise/fatigue, peripheral edema or shortness of breath.      Review of Systems  Constitutional:  Negative for malaise/fatigue.  Respiratory:  Negative for shortness of breath.   All other systems reviewed and are negative.   Social History   Socioeconomic History   Marital status: Married    Spouse name: Not on file   Number of children: Not on file   Years of education: Not on file   Highest education level: 12th grade  Occupational History   Not on file  Tobacco Use   Smoking status: Never   Smokeless tobacco: Never  Vaping Use   Vaping status: Never Used  Substance and Sexual Activity   Alcohol use: No   Drug use: No   Sexual activity: Yes    Birth control/protection: None  Other Topics Concern   Not on file  Social History Narrative   Not on file   Social Drivers of Health   Financial Resource Strain: Low Risk  (04/05/2024)   Overall Financial Resource Strain (CARDIA)    Difficulty of Paying Living Expenses: Not very hard  Food Insecurity: No Food Insecurity (07/14/2024)   Hunger Vital Sign    Worried About Running Out of Food in the Last Year: Never true    Ran Out of Food in  the Last Year: Never true  Transportation Needs: No Transportation Needs (07/14/2024)   PRAPARE - Administrator, Civil Service (Medical): No    Lack of Transportation (Non-Medical): No  Physical Activity: Insufficiently Active (04/05/2024)   Exercise Vital Sign    Days of Exercise per Week: 4 days    Minutes of Exercise per Session: 20 min  Stress: No Stress Concern Present (04/05/2024)   [employer] of Occupational Health - Occupational Stress Questionnaire    Feeling of Stress : Not at all  Social Connections: Socially Integrated (04/05/2024)   Social Connection and Isolation Panel    Frequency of Communication with Friends and Family: More than three times a week    Frequency of Social Gatherings with Friends and Family: Twice a week    Attends Religious Services: More than 4 times per year    Active Member of [employer] or Organizations: Yes    Attends Engineer, structural: More than 4 times per year    Marital Status: Married   Family History  Problem Relation Age of Onset   Heart disease Mother        atrial fibrillation   Cancer Paternal Aunt    Cancer Maternal Grandmother        breast   Early death Paternal Grandmother 81       myocardial  in   Alzheimer's disease Paternal Grandfather    Kidney Stones Brother    Thyroid disease Brother        produce too much calcium which cause kidney stones        Objective:   Physical Exam Vitals reviewed.  Constitutional:      General: He is not in acute distress.    Appearance: He is well-developed. He is obese.  HENT:     Head: Normocephalic.     Right Ear: Tympanic membrane normal.     Left Ear: Tympanic membrane normal.  Eyes:     General:        Right eye: No discharge.        Left eye: No discharge.     Pupils: Pupils are equal, round, and reactive to light.  Neck:     Thyroid: No thyromegaly.  Cardiovascular:     Rate and Rhythm: Normal rate and regular rhythm.     Heart sounds: Normal heart  sounds. No murmur heard. Pulmonary:     Effort: Pulmonary effort is normal. No respiratory distress.     Breath sounds: Normal breath sounds. No wheezing.  Abdominal:     General: Bowel sounds are normal. There is no distension.     Palpations: Abdomen is soft.     Tenderness: There is no abdominal tenderness.  Musculoskeletal:        General: No tenderness. Normal range of motion.     Cervical back: Normal range of motion and neck supple.  Skin:    General: Skin is warm and dry.     Findings: No erythema or rash.  Neurological:     Mental Status: He is alert and oriented to person, place, and time.     Cranial Nerves: No cranial nerve deficit.     Deep Tendon Reflexes: Reflexes are normal and symmetric.  Psychiatric:        Behavior: Behavior normal.        Thought Content: Thought content normal.        Judgment: Judgment normal.       BP 125/76   Pulse (!) 43   Temp (!) 97.4 F (36.3 C) (Temporal)   Ht 6' 1 (1.854 m)   Wt 241 lb (109.3 kg)   SpO2 98%   BMI 31.80 kg/m      Assessment & Plan:  Austin Smith comes in today with chief complaint of Hospitalization Follow-up   Diagnosis and orders addressed:  1. AKI (acute kidney injury) ([name]) (Primary) Will recheck labs today Stay hydrated - CMP14+EGFR - CBC with Differential/Platelet - CK  2. Hospital discharge follow-up Hospital notes resolved   3. Hypertension associated with type 2 diabetes mellitus ([name]) At goal  - CMP14+EGFR - CBC with Differential/Platelet - CK  4. Acute cough  - benzonatate  (TESSALON ) 200 MG capsule; Take 1 capsule (200 mg total) by mouth 3 (three) times daily as needed.  Dispense: 30 capsule; Refill: 1   Labs pending Hydrocodone  syrup placed on allegory list. Discussed not take again as probable cause of hallucinations Work note given to return to work on Sunday  Hospital notes reviewed  Continue current medications  Health Maintenance reviewed Diet and exercise  encouraged  Return in about 1 month (around 08/15/2024), or if symptoms worsen or fail to improve.    Bari Learn, FNP

## 2024-07-17 LAB — CMP14+EGFR
ALT: 18 IU/L (ref 0–44)
AST: 18 IU/L (ref 0–40)
Albumin: 4.3 g/dL (ref 3.8–4.9)
Alkaline Phosphatase: 69 IU/L (ref 44–121)
BUN/Creatinine Ratio: 17 (ref 9–20)
BUN: 17 mg/dL (ref 6–24)
Bilirubin Total: 0.3 mg/dL (ref 0.0–1.2)
CO2: 22 mmol/L (ref 20–29)
Calcium: 9.6 mg/dL (ref 8.7–10.2)
Chloride: 103 mmol/L (ref 96–106)
Creatinine, Ser: 0.98 mg/dL (ref 0.76–1.27)
Globulin, Total: 2.4 g/dL (ref 1.5–4.5)
Glucose: 91 mg/dL (ref 70–99)
Potassium: 4.8 mmol/L (ref 3.5–5.2)
Sodium: 142 mmol/L (ref 134–144)
Total Protein: 6.7 g/dL (ref 6.0–8.5)
eGFR: 90 mL/min/1.73 (ref >59)

## 2024-07-17 LAB — CBC WITH DIFFERENTIAL/PLATELET
Basophils Absolute: 0.1 x10E3/uL (ref 0.0–0.2)
Basos: 1 %
EOS (ABSOLUTE): 0.2 x10E3/uL (ref 0.0–0.4)
Eos: 3 %
Hematocrit: 41.1 % (ref 37.5–51.0)
Hemoglobin: 13.5 g/dL (ref 13.0–17.7)
Immature Grans (Abs): 0 x10E3/uL (ref 0.0–0.1)
Immature Granulocytes: 0 %
Lymphocytes Absolute: 1.7 x10E3/uL (ref 0.7–3.1)
Lymphs: 24 %
MCH: 30.4 pg (ref 26.6–33.0)
MCHC: 32.8 g/dL (ref 31.5–35.7)
MCV: 93 fL (ref 79–97)
Monocytes Absolute: 0.7 x10E3/uL (ref 0.1–0.9)
Monocytes: 10 %
Neutrophils Absolute: 4.6 x10E3/uL (ref 1.4–7.0)
Neutrophils: 62 %
Platelets: 228 x10E3/uL (ref 150–450)
RBC: 4.44 x10E6/uL (ref 4.14–5.80)
RDW: 13.7 % (ref 11.6–15.4)
WBC: 7.4 x10E3/uL (ref 3.4–10.8)

## 2024-07-17 LAB — CK: Total CK: 186 U/L (ref 41–331)

## 2024-07-18 ENCOUNTER — Telehealth: Payer: Self-pay

## 2024-07-18 ENCOUNTER — Telehealth: Payer: Self-pay | Admitting: Nurse Practitioner

## 2024-07-18 ENCOUNTER — Ambulatory Visit: Payer: Self-pay | Admitting: Family

## 2024-07-18 NOTE — Telephone Encounter (Signed)
 Called and spoke with patient he is aware of the only allergy listed on his med list.

## 2024-07-18 NOTE — Telephone Encounter (Signed)
Ok to switch to me

## 2024-07-18 NOTE — Telephone Encounter (Signed)
Fine with me to switch 

## 2024-07-18 NOTE — Telephone Encounter (Signed)
 Copied from CRM [id number]. Topic: Clinical - Medication Question >> Jul 18, 2024  9:31 AM Susanna ORN wrote: Reason for CRM: Patient called in stating that Dr. Lavell prescribed him Polyethylene Gycol 3350 on 07/16/24. He wants to know if he's allergic to it. Didn't mention anything about having any current symptoms but patient stated that Dr. Lavell put on his paper that he was allergic to some things. Please give patient a call to further advise. CB #: [id number].

## 2024-07-18 NOTE — Telephone Encounter (Signed)
 Copied from CRM [id number]. Topic: General - Other >> Jul 18, 2024  9:35 AM Susanna ORN wrote: Reason for CRM: Patient wants to know if he could switch over to Dr. Bari Learn being his PCP. He states he has nothing against Dr. Bradd but he was very satisfied with Dr. Learn and her diagnosis. Advised patient that as of now, Dr. Learn is not currently accepting new patients. He asked that if she does in the near future, would he be able to switch & if I could let her know? Advised him that I would place a note and if she does accepts new patients, someone could possibly contact him.

## 2024-08-26 ENCOUNTER — Ambulatory Visit: Admitting: Nurse Practitioner

## 2024-09-30 ENCOUNTER — Ambulatory Visit: Admitting: Family Medicine

## 2024-09-30 ENCOUNTER — Encounter: Payer: Self-pay | Admitting: Family Medicine

## 2024-09-30 VITALS — BP 128/76 | HR 57 | Temp 98.0°F | Ht 73.0 in | Wt 244.0 lb

## 2024-09-30 DIAGNOSIS — J329 Chronic sinusitis, unspecified: Secondary | ICD-10-CM | POA: Diagnosis not present

## 2024-09-30 DIAGNOSIS — J40 Bronchitis, not specified as acute or chronic: Secondary | ICD-10-CM | POA: Diagnosis not present

## 2024-09-30 MED ORDER — AMOXICILLIN-POT CLAVULANATE 875-125 MG PO TABS: 1.0000 | ORAL_TABLET | Freq: Two times a day (BID) | ORAL | 0 refills | Status: DC

## 2024-09-30 MED ORDER — BETAMETHASONE SOD PHOS & ACET 6 (3-3) MG/ML IJ SUSP
6.0000 mg | Freq: Once | INTRAMUSCULAR | Status: AC
  Administered 2024-09-30: 6 mg via INTRAMUSCULAR

## 2024-09-30 MED ORDER — PROMETHAZINE-DM 6.25-15 MG/5ML PO SYRP: 5.0000 mL | ORAL_SOLUTION | Freq: Four times a day (QID) | ORAL | 0 refills | Status: DC | PRN

## 2024-09-30 NOTE — Progress Notes (Signed)
 Chief Complaint  Patient presents with   Nasal Congestion    3 weeks Runny nose, sneezing, headache and wheezing at night and wont go away. Took otc cough syrup and goody powders.    HPI  Symptoms include congestion, facial pain, nasal congestion, non productive cough, post nasal drip and sinus pressure. There is no fever, chills, or sweats. Onset of symptoms was a few days ago, gradually worsening since that time.  Wheeziing intermitently. Minimal dyspnea. Onset of sx was 3-4 weeks ago. No fever.    History of Present Illness      PMH: Smoking status noted Review of Systems  Objective: BP 128/76   Pulse (!) 57   Temp 98 F (36.7 C)   Ht 6' 1 (1.854 m)   Wt 244 lb (110.7 kg)   SpO2 98%   BMI 32.19 kg/m  Gen: NAD, alert, cooperative with exam HEENT: NCAT, EOMI, PERRL. TMS clear. Some wax. Nasal paassages inflamed. Mild maxillary tenderness.  CV: RRR, good S1/S2, no murmur Resp: CTABL, no wheezes, non-labored Ext: No edema, warm Neuro: Alert and oriented, No gross deficits  Sinobronchitis -     Betamethasone  Sod Phos & Acet -     Amoxicillin -Pot Clavulanate; Take 1 tablet by mouth 2 (two) times daily. Take all of this medication  Dispense: 20 tablet; Refill: 0 -     Promethazine -DM; Take 5 mLs by mouth 4 (four) times daily as needed for cough.  Dispense: 240 mL; Refill: 0  Follow up if sx fail to resolve promptly. Rest at home 2 days.  Butler Der, M.D. Assessment & Plan       Butler Der, MD

## 2024-10-14 ENCOUNTER — Ambulatory Visit: Admitting: Nurse Practitioner

## 2024-10-28 ENCOUNTER — Encounter: Payer: Self-pay | Admitting: Family

## 2024-10-28 ENCOUNTER — Ambulatory Visit: Payer: Self-pay | Admitting: Family

## 2024-10-28 VITALS — BP 134/81 | HR 50 | Temp 97.5°F | Ht 73.0 in | Wt 253.0 lb

## 2024-10-28 DIAGNOSIS — Z0000 Encounter for general adult medical examination without abnormal findings: Secondary | ICD-10-CM

## 2024-10-28 DIAGNOSIS — E1159 Type 2 diabetes mellitus with other circulatory complications: Secondary | ICD-10-CM | POA: Diagnosis not present

## 2024-10-28 DIAGNOSIS — E785 Hyperlipidemia, unspecified: Secondary | ICD-10-CM

## 2024-10-28 DIAGNOSIS — E1169 Type 2 diabetes mellitus with other specified complication: Secondary | ICD-10-CM

## 2024-10-28 DIAGNOSIS — I152 Hypertension secondary to endocrine disorders: Secondary | ICD-10-CM | POA: Diagnosis not present

## 2024-10-28 DIAGNOSIS — H6123 Impacted cerumen, bilateral: Secondary | ICD-10-CM | POA: Diagnosis not present

## 2024-10-28 DIAGNOSIS — Z0001 Encounter for general adult medical examination with abnormal findings: Secondary | ICD-10-CM

## 2024-10-28 DIAGNOSIS — E119 Type 2 diabetes mellitus without complications: Secondary | ICD-10-CM

## 2024-10-28 DIAGNOSIS — Z1159 Encounter for screening for other viral diseases: Secondary | ICD-10-CM

## 2024-10-28 MED ORDER — SIMVASTATIN 40 MG PO TABS: ORAL_TABLET | ORAL | 1 refills | Status: AC

## 2024-10-28 NOTE — Progress Notes (Signed)
 "  Subjective:    Patient ID: Austin Smith, male    DOB: 1966/09/02, 58 y.o.   MRN: [id number]  Chief Complaint  Patient presents with   Establish Care   PT presents to the office today to establish care and CPE.  Hypertension This is a chronic problem. The current episode started more than 1 year ago. The problem has been waxing and waning since onset. The problem is uncontrolled. Pertinent negatives include no blurred vision, headaches, malaise/fatigue, peripheral edema or shortness of breath. Risk factors for coronary artery disease include dyslipidemia. The current treatment provides mild improvement.  Diabetes He presents for his follow-up diabetic visit. He has type 2 diabetes mellitus. His disease course has been stable. Pertinent negatives for hypoglycemia include no headaches. Pertinent negatives for diabetes include no blurred vision and no foot paresthesias. Risk factors for coronary artery disease include dyslipidemia, diabetes mellitus, hypertension, male sex and sedentary lifestyle. His overall blood glucose range is 110-130 mg/dl.  Hyperlipidemia This is a chronic problem. The current episode started more than 1 year ago. Exacerbating diseases include obesity. Pertinent negatives include no shortness of breath. Current antihyperlipidemic treatment includes statins. The current treatment provides moderate improvement of lipids. Risk factors for coronary artery disease include male sex, hypertension, a sedentary lifestyle, diabetes mellitus and dyslipidemia.   116/79   Review of Systems  Constitutional:  Negative for malaise/fatigue.  Eyes:  Negative for blurred vision.  Respiratory:  Negative for shortness of breath.   Neurological:  Negative for headaches.  All other systems reviewed and are negative.   Social History   Socioeconomic History   Marital status: Married    Spouse name: Not on file   Number of children: Not on file   Years of education: Not on file    Highest education level: 12th grade  Occupational History   Not on file  Tobacco Use   Smoking status: Never   Smokeless tobacco: Never  Vaping Use   Vaping status: Never Used  Substance and Sexual Activity   Alcohol use: No   Drug use: No   Sexual activity: Yes    Birth control/protection: None  Other Topics Concern   Not on file  Social History Narrative   Not on file   Social Drivers of Health   Tobacco Use: Low Risk (10/28/2024)   Patient History    Smoking Tobacco Use: Never    Smokeless Tobacco Use: Never    Passive Exposure: Not on file  Financial Resource Strain: Low Risk (04/05/2024)   Overall Financial Resource Strain (CARDIA)    Difficulty of Paying Living Expenses: Not very hard  Food Insecurity: No Food Insecurity (07/14/2024)   Epic    Worried About Radiation Protection Practitioner of Food in the Last Year: Never true    Ran Out of Food in the Last Year: Never true  Transportation Needs: No Transportation Needs (07/14/2024)   Epic    Lack of Transportation (Medical): No    Lack of Transportation (Non-Medical): No  Physical Activity: Insufficiently Active (04/05/2024)   Exercise Vital Sign    Days of Exercise per Week: 4 days    Minutes of Exercise per Session: 20 min  Stress: No Stress Concern Present (04/05/2024)   [employer] of Occupational Health - Occupational Stress Questionnaire    Feeling of Stress : Not at all  Social Connections: Socially Integrated (04/05/2024)   Social Connection and Isolation Panel    Frequency of Communication with Friends and Family: More than three  times a week    Frequency of Social Gatherings with Friends and Family: Twice a week    Attends Religious Services: More than 4 times per year    Active Member of Clubs or Organizations: Yes    Attends Banker Meetings: More than 4 times per year    Marital Status: Married  Depression (PHQ2-9): Low Risk (10/28/2024)   Depression (PHQ2-9)    PHQ-2 Score: 0  Alcohol Screen: Low Risk  (07/11/2024)   Alcohol Screen    Last Alcohol Screening Score (AUDIT): 0  Housing: Low Risk (07/14/2024)   Epic    Unable to Pay for Housing in the Last Year: No    Number of Times Moved in the Last Year: 0    Homeless in the Last Year: No  Utilities: Not At Risk (07/14/2024)   Epic    Threatened with loss of utilities: No  Health Literacy: Not on file   Family History  Problem Relation Age of Onset   Heart disease Mother        atrial fibrillation   Cancer Paternal Aunt    Cancer Maternal Grandmother        breast   Early death Paternal Grandmother 1       myocardial in   Alzheimer's disease Paternal Grandfather    Kidney Stones Brother    Thyroid disease Brother        produce too much calcium which cause kidney stones        Objective:   Physical Exam Vitals reviewed.  Constitutional:      General: He is not in acute distress.    Appearance: He is well-developed. He is obese.  HENT:     Head: Normocephalic.     Right Ear: There is impacted cerumen.     Left Ear: There is impacted cerumen.  Eyes:     General:        Right eye: No discharge.        Left eye: No discharge.     Pupils: Pupils are equal, round, and reactive to light.  Neck:     Thyroid: No thyromegaly.  Cardiovascular:     Rate and Rhythm: Normal rate and regular rhythm.     Heart sounds: Normal heart sounds. No murmur heard. Pulmonary:     Effort: Pulmonary effort is normal. No respiratory distress.     Breath sounds: Normal breath sounds. No wheezing.  Abdominal:     General: Bowel sounds are normal. There is no distension.     Palpations: Abdomen is soft.     Tenderness: There is no abdominal tenderness.  Musculoskeletal:        General: No tenderness. Normal range of motion.     Cervical back: Normal range of motion and neck supple.  Skin:    General: Skin is warm and dry.     Findings: No erythema or rash.  Neurological:     Mental Status: He is alert and oriented to person, place, and  time.     Cranial Nerves: No cranial nerve deficit.     Deep Tendon Reflexes: Reflexes are normal and symmetric.  Psychiatric:        Behavior: Behavior normal.        Thought Content: Thought content normal.        Judgment: Judgment normal.     Bilateral ears washed with warm water and peroxide, pt tolerated well.   BP 134/81   Pulse (!) 50  Temp (!) 97.5 F (36.4 C) (Temporal)   Ht 6' 1 (1.854 m)   Wt 253 lb (114.8 kg)   SpO2 97%   BMI 33.38 kg/m      Assessment & Plan:  Austin Smith comes in today with chief complaint of Establish Care   Diagnosis and orders addressed:  1. Type 2 diabetes mellitus without complication, without long-term current use of insulin  ([name]) - Bayer DCA Hb A1c Waived - CMP14+EGFR - CBC with Differential/Platelet - TSH - Vitamin B12  2. Hypertension associated with type 2 diabetes mellitus ([name]) - CMP14+EGFR - CBC with Differential/Platelet - TSH  3. Hyperlipidemia associated with type 2 diabetes mellitus ([name])  - CMP14+EGFR - CBC with Differential/Platelet - Lipid panel - simvastatin  (ZOCOR ) 40 MG tablet; TAKE ONE (1) TABLET EACH DAY  Dispense: 90 tablet; Refill: 1  4. Morbid obesity ([name]) - CMP14+EGFR - CBC with Differential/Platelet  5. Annual physical exam (Primary) - Bayer DCA Hb A1c Waived - CMP14+EGFR - CBC with Differential/Platelet - Lipid panel - PSA, total and free - TSH - Vitamin B12  6. Bilateral impacted cerumen - CMP14+EGFR - CBC with Differential/Platelet  7. Need for hepatitis B screening test - Hepatitis B surface antibody,quantitative  8. Need for hepatitis C screening test - Hepatitis C antibody   Labs pending Continue current medications  Health Maintenance reviewed Diet and exercise encouraged  Return in about 4 months (around 02/26/2025), or if symptoms worsen or fail to improve.    Bari Learn, FNP   "

## 2024-10-28 NOTE — Patient Instructions (Signed)

## 2024-11-08 ENCOUNTER — Ambulatory Visit (INDEPENDENT_AMBULATORY_CARE_PROVIDER_SITE_OTHER): Admitting: Podiatry

## 2024-11-08 ENCOUNTER — Ambulatory Visit: Admitting: Nurse Practitioner

## 2024-11-08 ENCOUNTER — Encounter: Payer: Self-pay | Admitting: Podiatry

## 2024-11-08 ENCOUNTER — Encounter: Payer: Self-pay | Admitting: Nurse Practitioner

## 2024-11-08 VITALS — BP 151/83 | HR 50 | Temp 98.2°F | Ht 73.0 in | Wt 252.0 lb

## 2024-11-08 DIAGNOSIS — L03031 Cellulitis of right toe: Secondary | ICD-10-CM

## 2024-11-08 DIAGNOSIS — L600 Ingrowing nail: Secondary | ICD-10-CM

## 2024-11-08 DIAGNOSIS — L02611 Cutaneous abscess of right foot: Secondary | ICD-10-CM

## 2024-11-08 MED ORDER — CEFTRIAXONE SODIUM 1 G IJ SOLR
1.0000 g | Freq: Once | INTRAMUSCULAR | Status: AC
  Administered 2024-11-08: 1 g via INTRAMUSCULAR

## 2024-11-08 NOTE — Patient Instructions (Signed)

## 2024-11-08 NOTE — Progress Notes (Signed)
 Patient complains draining ingrown hallux nail right redness on the hallux.  Was on oral antibiotic and yesterday got an injection of Rocephin .  Has noticed blistering on some of his other toes and some redness.  Not really a lot of pain with it just a little bit of aching... Patient denies fevers, chills, nausea, vomiting.  Objective:  Vitals: Reviewed  General: Well developed, nourished, in no acute distress, alert and oriented x3   Vascular: DP pulse 2/4 bilateral. PT pulse 2/4 bilateral.  Mild to moderate edema toe with ingrown nail.  Capillary refill time immediate bilaterally.  Moderate lower extremity edema bilaterally  Dermatology: Erythema, edema, incurvated nail border both borders hallux right with clear drainage . Tenderness present with palpation. Normal skin tone and texture feet with normal hair growth.  Blistering around the nail fold with some superficial ulceration on the distal aspect of the hallux right  Neurological: Decreased vibratory sensation feet bilaterally.  Absent monofilament sensation toes 1 through 5 right.  Normal reflexes bilaterally.   Musculoskeletal:  . No tenderness or painful ROM at IPJ.  Diagnosis: 1.  Cellulitis hallux right 2.  Ingrown nail hallux right  Plan: -New patient office visit for evaluation and management level 3.  Modifier 25. - Discussed in the ingrown nail.  I think is a component of neuropathic ulceration present also.  Will do an avulsion of the nail today.  Long-term may require matricectomy.  He is also get blistering and preulcerative areas on some of the other lesser toes.  -discussed etiology and treatment of ingrown nails. Discussed surgical vs conservative treatment. -Consent signed for appropriate matrixectomy affected nail(s). - Continue oral antibiotics until finished as prescribed.  Procedure(s):   - Avulsion hallux nail right: Toe anesthetized with 3cc 2:1 mixture 2% Lidocaine with epinephrine: Sodium Bicarbonate.  Surgical site prepped. Digital tourniquet applied.  Avulsion of nail plate. performed. .  Tourniquet released with good vascularity noticed in digit.  Applied triple antibiotic to nailbed and applied gauze and Coban dressing. - Written and oral postoperative instructions given.  -Return for follow-up 1 weeks.  JINNY Prentice Binder, DPM

## 2024-11-08 NOTE — Progress Notes (Signed)
" ° °  Subjective:    Patient ID: Austin Smith, male    DOB: 09-29-1966, 59 y.o.   MRN: [id number]   Chief Complaint: Recheck big toe on right foot   HPI  Patient says last Friday his right great toe started hurting. He looked at it and it was black appearing. He went to urgent cre and was dx with cellulitis. He was given keflex  and antibiotic ointment. Thehy also sent him to [facility] to have xray to make sure infection was not in bone. Xray was negative for osteomylitis. He says his toe is no longer black but now has a large blister on medial side.  Patient Active Problem List   Diagnosis Date Noted   AKI (acute kidney injury) 07/14/2024   Severe manic bipolar 1 disorder with psychotic behavior ([name]) 07/11/2024   Hypertension associated with type 2 diabetes mellitus ([name]) 07/22/2019   Type 2 diabetes mellitus without complication, without long-term current use of insulin  ([name]) 07/09/2019   Hyperlipidemia associated with type 2 diabetes mellitus ([name]) 10/03/2018   Morbid obesity ([name]) 07/19/2018   Last Friday his right great toe    Review of Systems  Constitutional:  Negative for diaphoresis.  Eyes:  Negative for pain.  Respiratory:  Negative for shortness of breath.   Cardiovascular:  Negative for chest pain, palpitations and leg swelling.  Gastrointestinal:  Negative for abdominal pain.  Endocrine: Negative for polydipsia.  Skin:  Negative for rash.  Neurological:  Negative for dizziness, weakness and headaches.  Hematological:  Does not bruise/bleed easily.  All other systems reviewed and are negative.      Objective:   Physical Exam Constitutional:      Appearance: Normal appearance. He is obese.  Cardiovascular:     Rate and Rhythm: Normal rate and regular rhythm.     Heart sounds: Normal heart sounds.  Pulmonary:     Breath sounds: Normal breath sounds.  Skin:    General: Skin is warm.     Comments: Thick blister on medial sid eof right great toe- distal  erythema 2+ pedal pulse palpable Brisk cap refill  Neurological:     General: No focal deficit present.     Mental Status: He is alert and oriented to person, place, and time.  Psychiatric:        Mood and Affect: Mood normal.        Behavior: Behavior normal.     BP (!) 151/83   Pulse (!) 50   Temp 98.2 F (36.8 C) (Temporal)   Ht 6' 1 (1.854 m)   Wt 252 lb (114.3 kg)   SpO2 98%   BMI 33.25 kg/m        Assessment & Plan:  Austin Smith in today with chief complaint of Recheck big toe on right foot   1. Cellulitis of great toe of right foot (Primary) Stat referral to podiatry Soak in epsom salt BID - cefTRIAXone  (ROCEPHIN ) injection 1 g - Ambulatory referral to Podiatry    The above assessment and management plan was discussed with the patient. The patient verbalized understanding of and has agreed to the management plan. Patient is aware to call the clinic if symptoms persist or worsen. Patient is aware when to return to the clinic for a follow-up visit. Patient educated on when it is appropriate to go to the emergency department.   Austin Gladis, FNP    "

## 2024-11-08 NOTE — Patient Instructions (Signed)

## 2024-11-18 ENCOUNTER — Encounter: Payer: Self-pay | Admitting: Podiatry

## 2024-11-18 ENCOUNTER — Ambulatory Visit (INDEPENDENT_AMBULATORY_CARE_PROVIDER_SITE_OTHER): Admitting: Podiatry

## 2024-11-18 DIAGNOSIS — S90829A Blister (nonthermal), unspecified foot, initial encounter: Secondary | ICD-10-CM

## 2024-11-18 DIAGNOSIS — L97501 Non-pressure chronic ulcer of other part of unspecified foot limited to breakdown of skin: Secondary | ICD-10-CM

## 2024-11-18 MED ORDER — MUPIROCIN 2 % EX OINT: 1.0000 | TOPICAL_OINTMENT | Freq: Two times a day (BID) | CUTANEOUS | 0 refills | Status: AC

## 2024-11-18 NOTE — Progress Notes (Signed)
 Send follow-up avulsion hallux nail right.  Also complains of medial blisters starting on the left foot.  He did get new shoes being fitted at [employer] [facility].  Had some the small blisters last time.  Physical Exam:  Patient alert and oriented x 3.  No complaints of nausea, vomiting, fever, or chills  Vascular: DP pulses 2/4 bilateral. PT pulses 2/4 lateral.  Moderate edema lower extremity. Capillary fill time immediate bilaterally.  Dermatologic: Superficial ulcer distal aspect hallux right.  Measures 12 x 5 mm.  Just penetration to the dermis.  Base granulation tissue.  Minimal devitalized tissue.  No signs of infection.  Nail avulsion site has healed well with decreased edema in the hallux.    Blisters noted on the dorsal aspect of the hallux left, distal aspect hallux left and second toe left.  These were de removed with clear drainage in them.  No signs of infection.  No deep penetration.  Neurologic:   Musculoskeletal: Hammertoes 2 through 5 bilaterally.  Hallux valgus bilaterally   Diagnoses: 1.  Superficial ulceration Wagner grade 1 right hallux distal 2.  Blisters 1st and 2nd toe left  Plan: - Established visit for evaluation and management level 3. -Discussed with him the ulceration on the distal hallux that still remains after the avulsion of the nail.  I think he did have combination of ingrown nail and ulceration.  Discussed the blisters on the left foot recommended wearing a Noncon sock and with good cushioning.  Will get him a surgical shoe left for this to get the pressure off to allow these areas to heal. -Sharp debridement with tissue nippers superficial Wagner grade 1 ulceration hallux right.  Debrided any devitalized tissue with a tissue nipper.  Applied antibiotic ointment and a light dressing -Rx Bactroban  ointment, apply twice daily to wounds, 3 refills -Wound care: Soak feet lukewarm salt water twice daily, apply Bactroban  ointment, and light dressing.   Return 1  week f/u ulcers

## 2024-12-02 ENCOUNTER — Ambulatory Visit: Admitting: Podiatry

## 2024-12-09 ENCOUNTER — Ambulatory Visit: Admitting: Podiatry

## 2024-12-11 ENCOUNTER — Ambulatory Visit: Admitting: Podiatry

## 2024-12-11 DIAGNOSIS — L97501 Non-pressure chronic ulcer of other part of unspecified foot limited to breakdown of skin: Secondary | ICD-10-CM

## 2024-12-11 NOTE — Progress Notes (Signed)
 Patient presents follow-up ulcers and blisters feet bilaterally.  Doing well seems to be healed.  Has not noticed any signs of infection.  No drainage.   Physical exam:  General appearance: Pleasant, and in no acute distress. AOx3.  Vascular: Pedal pulses: DP 2 to/4 bilaterally, PT 2 to/4 bilaterally.  Moderate edema lower legs bilaterally. Capillary fill time immediate bilaterally.  Neurological: Grossly intact bilaterally  Dermatologic:   Area of ulceration second toe and hallux left and hallux right are almost completely healed with no signs of infection.  Thin epithelial tissue covering them.  Mild inflammation still present.  Skin normal temperature bilaterally.  Skin normal color, tone, and texture bilaterally.   Musculoskeletal: Hammertoes 2 through 5 bilaterally hallux valgus bilaterally    Diagnosis: 1.  Superficial ulcerations  feet bilaterally.  Plan: -Established office visit for evaluation and management level 2. - Areas of healed.  Recommended wearing good well cushioned socks to keep pressure off these areas.  Keep the skin well-hydrated.  Use moisturizing cream and cover this with Vaseline.  Avoid shoes that rub on the toes.  If he notices any signs of skin breakdown or blistering again he should call us    Return as needed
